# Patient Record
Sex: Female | Born: 1950 | Race: White | Hispanic: No | Marital: Married | Smoking: Former smoker
Health system: Southern US, Community
[De-identification: ages and names within clinical notes are randomized; demographics above are authoritative.]

## PROBLEM LIST (undated history)

## (undated) DIAGNOSIS — I480 Paroxysmal atrial fibrillation: Secondary | ICD-10-CM

## (undated) DIAGNOSIS — I483 Typical atrial flutter: Secondary | ICD-10-CM

## (undated) DIAGNOSIS — I455 Other specified heart block: Secondary | ICD-10-CM

## (undated) DIAGNOSIS — R000 Tachycardia, unspecified: Secondary | ICD-10-CM

## (undated) HISTORY — DX: Other specified heart block: I45.5

## (undated) HISTORY — DX: Typical atrial flutter: I48.3

## (undated) HISTORY — DX: Tachycardia, unspecified: R00.0

## (undated) HISTORY — PX: TONSILLECTOMY: SUR1361

## (undated) HISTORY — DX: Paroxysmal atrial fibrillation: I48.0

---

## 2005-10-05 ENCOUNTER — Other Ambulatory Visit: Admission: RE | Admit: 2005-10-05 | Discharge: 2005-10-05 | Payer: Self-pay | Admitting: Obstetrics and Gynecology

## 2005-11-16 ENCOUNTER — Encounter: Admission: RE | Admit: 2005-11-16 | Discharge: 2005-11-16 | Payer: Self-pay | Admitting: Obstetrics and Gynecology

## 2007-01-10 ENCOUNTER — Encounter: Admission: RE | Admit: 2007-01-10 | Discharge: 2007-01-10 | Payer: Self-pay | Admitting: Obstetrics and Gynecology

## 2008-01-16 ENCOUNTER — Encounter: Admission: RE | Admit: 2008-01-16 | Discharge: 2008-01-16 | Payer: Self-pay | Admitting: Obstetrics and Gynecology

## 2009-03-16 ENCOUNTER — Encounter: Admission: RE | Admit: 2009-03-16 | Discharge: 2009-03-16 | Payer: Self-pay | Admitting: Obstetrics and Gynecology

## 2010-03-17 ENCOUNTER — Encounter: Admission: RE | Admit: 2010-03-17 | Discharge: 2010-03-17 | Payer: Self-pay | Admitting: Obstetrics and Gynecology

## 2011-03-31 ENCOUNTER — Other Ambulatory Visit: Payer: Self-pay | Admitting: Obstetrics and Gynecology

## 2011-03-31 DIAGNOSIS — Z1231 Encounter for screening mammogram for malignant neoplasm of breast: Secondary | ICD-10-CM

## 2011-04-21 ENCOUNTER — Ambulatory Visit
Admission: RE | Admit: 2011-04-21 | Discharge: 2011-04-21 | Disposition: A | Discharge status: Home / Self Care | Payer: BC Managed Care – PPO | Source: Ambulatory Visit | Attending: Obstetrics and Gynecology | Admitting: Obstetrics and Gynecology

## 2011-04-21 DIAGNOSIS — Z1231 Encounter for screening mammogram for malignant neoplasm of breast: Secondary | ICD-10-CM

## 2015-01-07 ENCOUNTER — Ambulatory Visit (INDEPENDENT_AMBULATORY_CARE_PROVIDER_SITE_OTHER): Payer: BLUE CROSS/BLUE SHIELD | Admitting: Podiatry

## 2015-01-07 ENCOUNTER — Encounter: Payer: Self-pay | Admitting: Podiatry

## 2015-01-07 ENCOUNTER — Ambulatory Visit (INDEPENDENT_AMBULATORY_CARE_PROVIDER_SITE_OTHER): Payer: BLUE CROSS/BLUE SHIELD

## 2015-01-07 VITALS — BP 112/68 | HR 66 | Resp 10 | Ht 69.0 in | Wt 150.0 lb

## 2015-01-07 DIAGNOSIS — M722 Plantar fascial fibromatosis: Secondary | ICD-10-CM

## 2015-01-07 DIAGNOSIS — M79671 Pain in right foot: Secondary | ICD-10-CM

## 2015-01-07 MED ORDER — MELOXICAM 7.5 MG PO TABS: 7.5000 mg | ORAL_TABLET | Freq: Every day | ORAL | Status: DC

## 2015-01-07 MED ORDER — TRIAMCINOLONE ACETONIDE 10 MG/ML IJ SUSP
10.0000 mg | Freq: Once | INTRAMUSCULAR | Status: AC
  Administered 2015-01-07: 10 mg

## 2015-01-07 NOTE — Progress Notes (Signed)
   Subjective:    Patient ID: Annette CablesCynthia Coombs, female    DOB: 12/27/1950, 64 y.o.   MRN: [id number]  HPI 64 year old female presents the office today with complaints of right heel pain which has been ongoing for approximately 3-4 months. She states that she has pain in her heel. Her pain is particularly after rest and is relieved somewhat with ambulation. More recently the pain has become more consistent as a dull ache to the bottom of her heel and denies any sharp pains. The pain does not wake her up at night. She denies any recent injury or trauma to the area. She denies any change in activity at the onset of symptoms. Denies any swelling or redness over the area. She has been stretching and taking over-the-counter anti-inflammatories without much relief in symptoms. No other complaints at this time.   Review of Systems  All other systems reviewed and are negative.      Objective:   Physical Exam AAO x3, NAD DP/PT pulses palpable bilaterally, CRT less than 3 seconds Protective sensation intact with Simms Weinstein monofilament, vibratory sensation intact, Achilles tendon reflex intact Tenderness to palpation overlying the plantar medial tubercle of the calcaneus to right heel at the insertion of the plantar fascia. There is no pain along the course of plantar fascial within the arch of the foot. There is no pain with lateral compression of the calcaneus or pain the vibratory sensation. No pain on the posterior aspect of the calcaneus or along the course/insertion of the Achilles tendon. There is no overlying edema, erythema, increase in warmth. No other areas of tenderness palpation or pain with vibratory sensation to the foot/ankle to bilateral lower extremities. Decrease in first MTPJ range of motion bilaterally. MMT 5/5, ROM WNL except for otherwise mentioned. No open lesions or pre-ulcerative lesions are identified. No pain with calf compression, swelling, warmth, erythema.       Assessment & Plan:  64 year old female with right heel pain, likely plantar fasciitis. -X-rays were obtained and reviewed with the patient. -Treatment options were discussed the patient including alternatives, risks, complications. -Patient elects to proceed with steroid injection into the right heel. Under sterile skin preparation, a total of 2.5cc of kenalog 10, 0.5% Marcaine plain, and 2% lidocaine plain were infiltrated into the symptomatic area without complication. A band-aid was applied. Patient tolerated the injection well without complication. Post-injection care with discussed with the patient. Discussed with the patient to ice the area over the next couple of days to help prevent a steroid flare.  -Plantar fascial taping was applied. Once the tape is removed dispensed a plantar fascial brace to wear. -Prescribed meloxicam. Discussed side effects the medication directed to stop if any are to occur call the office. -Discussed stretching exercises. -Ice to the area. -Continue with supportive shoes at all times. She has recently purchased new, more supportive shoes. -Follow-up in 3 weeks or sooner if any problems are to arise. In the meantime, encouraged to call the office with any questions, concerns, change in symptoms.

## 2015-01-07 NOTE — Patient Instructions (Signed)
Plantar Fasciitis (Heel Spur Syndrome) with Rehab The plantar fascia is a fibrous, ligament-like, soft-tissue structure that spans the bottom of the foot. Plantar fasciitis is a condition that causes pain in the foot due to inflammation of the tissue. SYMPTOMS   Pain and tenderness on the underneath side of the foot.  Pain that worsens with standing or walking. CAUSES  Plantar fasciitis is caused by irritation and injury to the plantar fascia on the underneath side of the foot. Common mechanisms of injury include:  Direct trauma to bottom of the foot.  Damage to a small nerve that runs under the foot where the main fascia attaches to the heel bone.  Stress placed on the plantar fascia due to bone spurs. RISK INCREASES WITH:   Activities that place stress on the plantar fascia (running, jumping, pivoting, or cutting).  Poor strength and flexibility.  Improperly fitted shoes.  Tight calf muscles.  Flat feet.  Failure to warm-up properly before activity.  Obesity. PREVENTION  Warm up and stretch properly before activity.  Allow for adequate recovery between workouts.  Maintain physical fitness:  Strength, flexibility, and endurance.  Cardiovascular fitness.  Maintain a health body weight.  Avoid stress on the plantar fascia.  Wear properly fitted shoes, including arch supports for individuals who have flat feet. PROGNOSIS  If treated properly, then the symptoms of plantar fasciitis usually resolve without surgery. However, occasionally surgery is necessary. RELATED COMPLICATIONS   Recurrent symptoms that may result in a chronic condition.  Problems of the lower back that are caused by compensating for the injury, such as limping.  Pain or weakness of the foot during push-off following surgery.  Chronic inflammation, scarring, and partial or complete fascia tear, occurring more often from repeated injections. TREATMENT  Treatment initially involves the use of  ice and medication to help reduce pain and inflammation. The use of strengthening and stretching exercises may help reduce pain with activity, especially stretches of the Achilles tendon. These exercises may be performed at home or with a therapist. Your caregiver may recommend that you use heel cups of arch supports to help reduce stress on the plantar fascia. Occasionally, corticosteroid injections are given to reduce inflammation. If symptoms persist for greater than 6 months despite non-surgical (conservative), then surgery may be recommended.  MEDICATION   If pain medication is necessary, then nonsteroidal anti-inflammatory medications, such as aspirin and ibuprofen, or other minor pain relievers, such as acetaminophen, are often recommended.  Do not take pain medication within 7 days before surgery.  Prescription pain relievers may be given if deemed necessary by your caregiver. Use only as directed and only as much as you need.  Corticosteroid injections may be given by your caregiver. These injections should be reserved for the most serious cases, because they may only be given a certain number of times. HEAT AND COLD  Cold treatment (icing) relieves pain and reduces inflammation. Cold treatment should be applied for 10 to 15 minutes every 2 to 3 hours for inflammation and pain and immediately after any activity that aggravates your symptoms. Use ice packs or massage the area with a piece of ice (ice massage).  Heat treatment may be used prior to performing the stretching and strengthening activities prescribed by your caregiver, physical therapist, or athletic trainer. Use a heat pack or soak the injury in warm water. SEEK IMMEDIATE MEDICAL CARE IF:  Treatment seems to offer no benefit, or the condition worsens.  Any medications produce adverse side effects. EXERCISES RANGE   OF MOTION (ROM) AND STRETCHING EXERCISES - Plantar Fasciitis (Heel Spur Syndrome) These exercises may help you  when beginning to rehabilitate your injury. Your symptoms may resolve with or without further involvement from your physician, physical therapist or athletic trainer. While completing these exercises, remember:   Restoring tissue flexibility helps normal motion to return to the joints. This allows healthier, less painful movement and activity.  An effective stretch should be held for at least 30 seconds.  A stretch should never be painful. You should only feel a gentle lengthening or release in the stretched tissue. RANGE OF MOTION - Toe Extension, Flexion  Sit with your right / left leg crossed over your opposite knee.  Grasp your toes and gently pull them back toward the top of your foot. You should feel a stretch on the bottom of your toes and/or foot.  Hold this stretch for __________ seconds.  Now, gently pull your toes toward the bottom of your foot. You should feel a stretch on the top of your toes and or foot.  Hold this stretch for __________ seconds. Repeat __________ times. Complete this stretch __________ times per day.  RANGE OF MOTION - Ankle Dorsiflexion, Active Assisted  Remove shoes and sit on a chair that is preferably not on a carpeted surface.  Place right / left foot under knee. Extend your opposite leg for support.  Keeping your heel down, slide your right / left foot back toward the chair until you feel a stretch at your ankle or calf. If you do not feel a stretch, slide your bottom forward to the edge of the chair, while still keeping your heel down.  Hold this stretch for __________ seconds. Repeat __________ times. Complete this stretch __________ times per day.  STRETCH - Gastroc, Standing  Place hands on wall.  Extend right / left leg, keeping the front knee somewhat bent.  Slightly point your toes inward on your back foot.  Keeping your right / left heel on the floor and your knee straight, shift your weight toward the wall, not allowing your back to  arch.  You should feel a gentle stretch in the right / left calf. Hold this position for __________ seconds. Repeat __________ times. Complete this stretch __________ times per day. STRETCH - Soleus, Standing  Place hands on wall.  Extend right / left leg, keeping the other knee somewhat bent.  Slightly point your toes inward on your back foot.  Keep your right / left heel on the floor, bend your back knee, and slightly shift your weight over the back leg so that you feel a gentle stretch deep in your back calf.  Hold this position for __________ seconds. Repeat __________ times. Complete this stretch __________ times per day. STRETCH - Gastrocsoleus, Standing  Note: This exercise can place a lot of stress on your foot and ankle. Please complete this exercise only if specifically instructed by your caregiver.   Place the ball of your right / left foot on a step, keeping your other foot firmly on the same step.  Hold on to the wall or a rail for balance.  Slowly lift your other foot, allowing your body weight to press your heel down over the edge of the step.  You should feel a stretch in your right / left calf.  Hold this position for __________ seconds.  Repeat this exercise with a slight bend in your right / left knee. Repeat __________ times. Complete this stretch __________ times per day.    STRENGTHENING EXERCISES - Plantar Fasciitis (Heel Spur Syndrome)  These exercises may help you when beginning to rehabilitate your injury. They may resolve your symptoms with or without further involvement from your physician, physical therapist or athletic trainer. While completing these exercises, remember:   Muscles can gain both the endurance and the strength needed for everyday activities through controlled exercises.  Complete these exercises as instructed by your physician, physical therapist or athletic trainer. Progress the resistance and repetitions only as guided. STRENGTH -  Towel Curls  Sit in a chair positioned on a non-carpeted surface.  Place your foot on a towel, keeping your heel on the floor.  Pull the towel toward your heel by only curling your toes. Keep your heel on the floor.  If instructed by your physician, physical therapist or athletic trainer, add ____________________ at the end of the towel. Repeat __________ times. Complete this exercise __________ times per day. STRENGTH - Ankle Inversion  Secure one end of a rubber exercise band/tubing to a fixed object (table, pole). Loop the other end around your foot just before your toes.  Place your fists between your knees. This will focus your strengthening at your ankle.  Slowly, pull your big toe up and in, making sure the band/tubing is positioned to resist the entire motion.  Hold this position for __________ seconds.  Have your muscles resist the band/tubing as it slowly pulls your foot back to the starting position. Repeat __________ times. Complete this exercises __________ times per day.  Document Released: 11/21/2005 Document Revised: 02/13/2012 Document Reviewed: 03/05/2009 [employer] Patient Information 2015 [employer]. This information is not intended to replace advice given to you by your health care provider. Make sure you discuss any questions you have with your health care provider.  

## 2015-01-28 ENCOUNTER — Ambulatory Visit (INDEPENDENT_AMBULATORY_CARE_PROVIDER_SITE_OTHER): Payer: BLUE CROSS/BLUE SHIELD | Admitting: Podiatry

## 2015-01-28 ENCOUNTER — Encounter: Payer: Self-pay | Admitting: Podiatry

## 2015-01-28 VITALS — BP 99/68 | HR 62 | Resp 18

## 2015-01-28 DIAGNOSIS — M722 Plantar fascial fibromatosis: Secondary | ICD-10-CM

## 2015-01-28 NOTE — Patient Instructions (Signed)
Plantar Fasciitis (Heel Spur Syndrome) with Rehab The plantar fascia is a fibrous, ligament-like, soft-tissue structure that spans the bottom of the foot. Plantar fasciitis is a condition that causes pain in the foot due to inflammation of the tissue. SYMPTOMS   Pain and tenderness on the underneath side of the foot.  Pain that worsens with standing or walking. CAUSES  Plantar fasciitis is caused by irritation and injury to the plantar fascia on the underneath side of the foot. Common mechanisms of injury include:  Direct trauma to bottom of the foot.  Damage to a small nerve that runs under the foot where the main fascia attaches to the heel bone.  Stress placed on the plantar fascia due to bone spurs. RISK INCREASES WITH:   Activities that place stress on the plantar fascia (running, jumping, pivoting, or cutting).  Poor strength and flexibility.  Improperly fitted shoes.  Tight calf muscles.  Flat feet.  Failure to warm-up properly before activity.  Obesity. PREVENTION  Warm up and stretch properly before activity.  Allow for adequate recovery between workouts.  Maintain physical fitness:  Strength, flexibility, and endurance.  Cardiovascular fitness.  Maintain a health body weight.  Avoid stress on the plantar fascia.  Wear properly fitted shoes, including arch supports for individuals who have flat feet. PROGNOSIS  If treated properly, then the symptoms of plantar fasciitis usually resolve without surgery. However, occasionally surgery is necessary. RELATED COMPLICATIONS   Recurrent symptoms that may result in a chronic condition.  Problems of the lower back that are caused by compensating for the injury, such as limping.  Pain or weakness of the foot during push-off following surgery.  Chronic inflammation, scarring, and partial or complete fascia tear, occurring more often from repeated injections. TREATMENT  Treatment initially involves the use of  ice and medication to help reduce pain and inflammation. The use of strengthening and stretching exercises may help reduce pain with activity, especially stretches of the Achilles tendon. These exercises may be performed at home or with a therapist. Your caregiver may recommend that you use heel cups of arch supports to help reduce stress on the plantar fascia. Occasionally, corticosteroid injections are given to reduce inflammation. If symptoms persist for greater than 6 months despite non-surgical (conservative), then surgery may be recommended.  MEDICATION   If pain medication is necessary, then nonsteroidal anti-inflammatory medications, such as aspirin and ibuprofen, or other minor pain relievers, such as acetaminophen, are often recommended.  Do not take pain medication within 7 days before surgery.  Prescription pain relievers may be given if deemed necessary by your caregiver. Use only as directed and only as much as you need.  Corticosteroid injections may be given by your caregiver. These injections should be reserved for the most serious cases, because they may only be given a certain number of times. HEAT AND COLD  Cold treatment (icing) relieves pain and reduces inflammation. Cold treatment should be applied for 10 to 15 minutes every 2 to 3 hours for inflammation and pain and immediately after any activity that aggravates your symptoms. Use ice packs or massage the area with a piece of ice (ice massage).  Heat treatment may be used prior to performing the stretching and strengthening activities prescribed by your caregiver, physical therapist, or athletic trainer. Use a heat pack or soak the injury in warm water. SEEK IMMEDIATE MEDICAL CARE IF:  Treatment seems to offer no benefit, or the condition worsens.  Any medications produce adverse side effects. EXERCISES RANGE   OF MOTION (ROM) AND STRETCHING EXERCISES - Plantar Fasciitis (Heel Spur Syndrome) These exercises may help you  when beginning to rehabilitate your injury. Your symptoms may resolve with or without further involvement from your physician, physical therapist or athletic trainer. While completing these exercises, remember:   Restoring tissue flexibility helps normal motion to return to the joints. This allows healthier, less painful movement and activity.  An effective stretch should be held for at least 30 seconds.  A stretch should never be painful. You should only feel a gentle lengthening or release in the stretched tissue. RANGE OF MOTION - Toe Extension, Flexion  Sit with your right / left leg crossed over your opposite knee.  Grasp your toes and gently pull them back toward the top of your foot. You should feel a stretch on the bottom of your toes and/or foot.  Hold this stretch for __________ seconds.  Now, gently pull your toes toward the bottom of your foot. You should feel a stretch on the top of your toes and or foot.  Hold this stretch for __________ seconds. Repeat __________ times. Complete this stretch __________ times per day.  RANGE OF MOTION - Ankle Dorsiflexion, Active Assisted  Remove shoes and sit on a chair that is preferably not on a carpeted surface.  Place right / left foot under knee. Extend your opposite leg for support.  Keeping your heel down, slide your right / left foot back toward the chair until you feel a stretch at your ankle or calf. If you do not feel a stretch, slide your bottom forward to the edge of the chair, while still keeping your heel down.  Hold this stretch for __________ seconds. Repeat __________ times. Complete this stretch __________ times per day.  STRETCH - Gastroc, Standing  Place hands on wall.  Extend right / left leg, keeping the front knee somewhat bent.  Slightly point your toes inward on your back foot.  Keeping your right / left heel on the floor and your knee straight, shift your weight toward the wall, not allowing your back to  arch.  You should feel a gentle stretch in the right / left calf. Hold this position for __________ seconds. Repeat __________ times. Complete this stretch __________ times per day. STRETCH - Soleus, Standing  Place hands on wall.  Extend right / left leg, keeping the other knee somewhat bent.  Slightly point your toes inward on your back foot.  Keep your right / left heel on the floor, bend your back knee, and slightly shift your weight over the back leg so that you feel a gentle stretch deep in your back calf.  Hold this position for __________ seconds. Repeat __________ times. Complete this stretch __________ times per day. STRETCH - Gastrocsoleus, Standing  Note: This exercise can place a lot of stress on your foot and ankle. Please complete this exercise only if specifically instructed by your caregiver.   Place the ball of your right / left foot on a step, keeping your other foot firmly on the same step.  Hold on to the wall or a rail for balance.  Slowly lift your other foot, allowing your body weight to press your heel down over the edge of the step.  You should feel a stretch in your right / left calf.  Hold this position for __________ seconds.  Repeat this exercise with a slight bend in your right / left knee. Repeat __________ times. Complete this stretch __________ times per day.    STRENGTHENING EXERCISES - Plantar Fasciitis (Heel Spur Syndrome)  These exercises may help you when beginning to rehabilitate your injury. They may resolve your symptoms with or without further involvement from your physician, physical therapist or athletic trainer. While completing these exercises, remember:   Muscles can gain both the endurance and the strength needed for everyday activities through controlled exercises.  Complete these exercises as instructed by your physician, physical therapist or athletic trainer. Progress the resistance and repetitions only as guided. STRENGTH -  Towel Curls  Sit in a chair positioned on a non-carpeted surface.  Place your foot on a towel, keeping your heel on the floor.  Pull the towel toward your heel by only curling your toes. Keep your heel on the floor.  If instructed by your physician, physical therapist or athletic trainer, add ____________________ at the end of the towel. Repeat __________ times. Complete this exercise __________ times per day. STRENGTH - Ankle Inversion  Secure one end of a rubber exercise band/tubing to a fixed object (table, pole). Loop the other end around your foot just before your toes.  Place your fists between your knees. This will focus your strengthening at your ankle.  Slowly, pull your big toe up and in, making sure the band/tubing is positioned to resist the entire motion.  Hold this position for __________ seconds.  Have your muscles resist the band/tubing as it slowly pulls your foot back to the starting position. Repeat __________ times. Complete this exercises __________ times per day.  Document Released: 11/21/2005 Document Revised: 02/13/2012 Document Reviewed: 03/05/2009 [employer] Patient Information 2015 [employer]. This information is not intended to replace advice given to you by your health care provider. Make sure you discuss any questions you have with your health care provider.  

## 2015-02-01 NOTE — Progress Notes (Signed)
Patient ID: Annette CablesCynthia Hampton, female   DOB: 12/05/1951, 64 y.o.   MRN: [id number]  Subjective: 64 year old female presents the office they for follow-up evaluation of right heel pain, plantar fasciitis. She states that since last appointment she is significantly improved although she does continue to have some mild intermittent discomfort. She's been continuing the stretching and icing exercises daily. She is also committed taking the anti-inflammatory as needed. She is requesting another steroid injection at this time to help resolve any lingering pain. She denies any acute changes since last appointment and no other complaints at this time. Denies any systemic complaints as fevers, chills, nausea, vomiting.  Objective: AAO x3, NAD DP/PT pulses palpable bilaterally, CRT less than 3 seconds Protective sensation intact with Simms Weinstein monofilament, vibratory sensation intact, Achilles tendon reflex intact There is mild tenderness to palpation overlying the plantar medial tubercle of the calcaneus to right heel at the insertion of the plantar fascia, although it does appear to be decreased compared to last appointment. There is no pain along the course of plantar fascial within the arch of the foot and the plantar fascia appears intact. There is no pain with lateral compression of the calcaneus or pain the vibratory sensation. No pain on the posterior aspect of the calcaneus or along the course/insertion of the Achilles tendon. There is no overlying edema, erythema, increase in warmth. No other areas of tenderness palpation or pain with vibratory sensation to the foot/ankle bilaterally. MMT 5/5, ROM WNL No open lesions or pre-ulcerative lesions are identified. No pain with calf compression, swelling, warmth, erythema.  Assessment: 64 year old female with resolving right heel pain, plantar fasciitis.  Plan: -Treatment options were discussed the patient including alternatives, risks,  complications. -Patient elects to proceed with steroid injection into the right heel. Under sterile skin preparation, a total of 2.5cc of kenalog 10, 0.5% Marcaine plain, and 2% lidocaine plain were infiltrated into the symptomatic area without complication. A band-aid was applied. Patient tolerated the injection well without complication. Post-injection care with discussed with the patient. Discussed with the patient to ice the area over the next couple of days to help prevent a steroid flare.  -Plantar fascial taping was applied. Once the tape is removed she can continue the plantar fascial brace. -Continue with ice and stretching activities. -Continue meloxicam as needed. Discussed side effects and directed to stop immediately if any are to occur call the office. -Discussed shoe modifications and possible orthotics.

## 2015-02-25 ENCOUNTER — Ambulatory Visit: Payer: BLUE CROSS/BLUE SHIELD | Admitting: Podiatry

## 2016-08-23 ENCOUNTER — Other Ambulatory Visit: Payer: Self-pay | Admitting: Obstetrics and Gynecology

## 2016-08-23 DIAGNOSIS — M810 Age-related osteoporosis without current pathological fracture: Secondary | ICD-10-CM

## 2016-08-29 ENCOUNTER — Other Ambulatory Visit: Payer: Self-pay | Admitting: Obstetrics and Gynecology

## 2016-08-29 DIAGNOSIS — E2839 Other primary ovarian failure: Secondary | ICD-10-CM

## 2016-09-27 ENCOUNTER — Other Ambulatory Visit: Payer: Self-pay

## 2016-10-04 ENCOUNTER — Ambulatory Visit
Admission: RE | Admit: 2016-10-04 | Discharge: 2016-10-04 | Disposition: A | Discharge status: Home / Self Care | Payer: Medicare Other | Source: Ambulatory Visit | Attending: Obstetrics and Gynecology | Admitting: Obstetrics and Gynecology

## 2016-10-04 DIAGNOSIS — E2839 Other primary ovarian failure: Secondary | ICD-10-CM

## 2019-07-02 ENCOUNTER — Other Ambulatory Visit: Payer: Self-pay | Admitting: Internal Medicine

## 2019-07-02 DIAGNOSIS — M810 Age-related osteoporosis without current pathological fracture: Secondary | ICD-10-CM

## 2019-09-12 ENCOUNTER — Other Ambulatory Visit: Payer: Self-pay

## 2019-09-12 ENCOUNTER — Ambulatory Visit
Admission: RE | Admit: 2019-09-12 | Discharge: 2019-09-12 | Disposition: A | Discharge status: Home / Self Care | Payer: Medicare Other | Source: Ambulatory Visit | Attending: Internal Medicine | Admitting: Internal Medicine

## 2019-09-12 DIAGNOSIS — M810 Age-related osteoporosis without current pathological fracture: Secondary | ICD-10-CM

## 2019-12-24 ENCOUNTER — Ambulatory Visit: Payer: Medicare Other | Attending: Nurse Practitioner

## 2019-12-24 DIAGNOSIS — Z23 Encounter for immunization: Secondary | ICD-10-CM | POA: Insufficient documentation

## 2019-12-24 NOTE — Progress Notes (Signed)
   Covid-19 Vaccination Clinic  Name:  Annette Hampton    MRN: [id number] DOB: 1951/01/17  12/24/2019  Annette Hampton was observed post Covid-19 immunization for 15 minutes without incidence. She was provided with Vaccine Information Sheet and instruction to access the V-Safe system.   Annette Hampton was instructed to call 911 with any severe reactions post vaccine: Marland Kitchen Difficulty breathing  . Swelling of your face and throat  . A fast heartbeat  . A bad rash all over your body  . Dizziness and weakness    Immunizations Administered    Name Date Dose VIS Date Route   Pfizer COVID-19 Vaccine 12/24/2019  6:02 PM 0.3 mL 11/15/2019 Intramuscular   Manufacturer: [employer], [employer]   Lot: [id number]   NDC: [address]-9

## 2020-01-12 ENCOUNTER — Ambulatory Visit: Payer: Medicare Other | Attending: Internal Medicine

## 2020-01-12 DIAGNOSIS — Z23 Encounter for immunization: Secondary | ICD-10-CM | POA: Insufficient documentation

## 2020-01-12 NOTE — Progress Notes (Signed)
   Covid-19 Vaccination Clinic  Name:  Annette Hampton    MRN: [id number] DOB: Jan 05, 1951  01/12/2020  [name] was observed post Covid-19 immunization for 15 minutes without incidence. She was provided with Vaccine Information Sheet and instruction to access the V-Safe system.   [name] was instructed to call 911 with any severe reactions post vaccine: Marland Kitchen Difficulty breathing  . Swelling of your face and throat  . A fast heartbeat  . A bad rash all over your body  . Dizziness and weakness    Immunizations Administered    Name Date Dose VIS Date Route   Pfizer COVID-19 Vaccine 01/12/2020 11:21 AM 0.3 mL 11/15/2019 Intramuscular   Manufacturer: [employer], [employer]   Lot: [id number]   NDC: [id number]

## 2020-07-16 ENCOUNTER — Other Ambulatory Visit (HOSPITAL_COMMUNITY)
Admission: RE | Admit: 2020-07-16 | Discharge: 2020-07-16 | Disposition: A | Discharge status: Home / Self Care | Payer: Medicare Other | Source: Ambulatory Visit | Attending: Cardiology | Admitting: Cardiology

## 2020-07-16 ENCOUNTER — Ambulatory Visit: Payer: Medicare Other | Admitting: Cardiology

## 2020-07-16 ENCOUNTER — Encounter: Payer: Self-pay | Admitting: Cardiology

## 2020-07-16 ENCOUNTER — Other Ambulatory Visit: Payer: Self-pay

## 2020-07-16 VITALS — BP 119/85 | HR 143 | Resp 17 | Ht 69.0 in | Wt 160.0 lb

## 2020-07-16 DIAGNOSIS — I483 Typical atrial flutter: Secondary | ICD-10-CM

## 2020-07-16 DIAGNOSIS — Z01812 Encounter for preprocedural laboratory examination: Secondary | ICD-10-CM | POA: Diagnosis present

## 2020-07-16 DIAGNOSIS — Z20822 Contact with and (suspected) exposure to covid-19: Secondary | ICD-10-CM | POA: Insufficient documentation

## 2020-07-16 DIAGNOSIS — L603 Nail dystrophy: Secondary | ICD-10-CM | POA: Insufficient documentation

## 2020-07-16 LAB — SARS CORONAVIRUS 2 (TAT 6-24 HRS): SARS Coronavirus 2: NEGATIVE

## 2020-07-16 MED ORDER — APIXABAN 5 MG PO TABS: 5.0000 mg | ORAL_TABLET | Freq: Two times a day (BID) | ORAL | 2 refills | Status: DC

## 2020-07-16 NOTE — Progress Notes (Signed)
Patient referred by Wenda Low, MD for tachycardia  Subjective:   Annette Hampton, female    DOB: 04-12-1951, 69 y.o.   MRN: [id number]   Chief Complaint  Patient presents with  . Tachycardia  . New Patient (Initial Visit)     HPI  69 y.o. Caucasian female with tachycardia.  Patient has no baseline medical problems, other than osteoporosis.  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 She denies any palpitations symptoms. Also denies orthopnea, PND, leg edema.  Patient was at her routine PCP visit on 07/13/2020. EKG then and now showed tachycardia >140 bpm, details below.   She occasionally drinks a beer, drinks 2 cups of coffee everyday. She has not performed any strenuous exercise since her visit with her PCP. She was started on metoprolol xL 25 mg daily by PCP Dr. Denton Ar.     Past Medical History:  Diagnosis Date  . Tachycardia      Past Surgical History:  Procedure Laterality Date  . TONSILLECTOMY       Social History   Tobacco Use  Smoking Status Former Smoker  . Packs/day: 0.25  . Years: 3.00  . Pack years: 0.75  . Types: Cigarettes  . Quit date: 73  . Years since quitting: 23.6  Smokeless Tobacco Former Systems developer    Social History   Substance and Sexual Activity  Alcohol Use Yes   Comment: occasional beer     Family History  Problem Relation Age of Onset  . Parkinson's disease Father      Current Outpatient Medications on File Prior to Visit  Medication Sig Dispense Refill  . CALCIUM CITRATE PO Take by mouth.    . Multiple Vitamin (MULTI-VITAMIN DAILY PO) Take by mouth.    Marland Kitchen alendronate (FOSAMAX) 70 MG tablet Take 70 mg by mouth once a week.    . metoprolol succinate (TOPROL-XL) 25 MG 24 hr tablet Take 25 mg by mouth daily.     No current facility-administered medications on file prior to visit.    Cardiovascular and other  pertinent studies:  EKG 07/16/2020: Typical atrial flutter 2: 1 conduction RVR 145 bpm Nonspecific ST-T changes   EKG 07/13/2020: Typical atrial flutter 144 bpm  2: 1 AV conduction    Recent labs: 07/13/2020: Glucose 89, BUN/Cr 17/1.13. EGFR 48/58. Na/K 141/4.4. T.bili 1.5. Rest of the CMP normal H/H 16/46. MCV 101. Platelets 194 Chol 191, TG 73, HDL 78, LDL 100 TSH 2.8 normal    Review of Systems  Cardiovascular: Negative for chest pain, dyspnea on exertion, leg swelling, palpitations and syncope.         Vitals:   07/16/20 1415  BP: 119/85  Pulse: (!) 143  Resp: 17  SpO2: 97%     Body mass index is 23.63 kg/m. Filed Weights   07/16/20 1415  Weight: 160 lb (72.6 kg)     Objective:   Physical Exam Vitals and nursing note reviewed.  Constitutional:      General: She is not in acute distress. Neck:     Vascular: No JVD.  Cardiovascular:     Rate and Rhythm: Regular rhythm. Tachycardia present.     Pulses: Normal pulses.     Heart sounds: Normal heart sounds. No murmur heard.   Pulmonary:     Effort: Pulmonary effort is normal.     Breath sounds: Normal breath sounds.  No wheezing or rales.          Assessment & Recommendations:    69 y/o Caucasian female with osteoporosis, now with new onset atrial flutter with RVR   Atrial flutter: Typical atrial flutter, present acute since 07/13/2020.  Patient does not have any specific symptoms related to it, thus making estimation of acute onset difficult. RVR in 140s while on metoprolol succinate 25 mg daily. Recommend increasing to 50 mg daily. CHA2DS2-VASc score 2, annual stroke risk 2%. Recommend eliquis 5 mg bid for now But she does not have any symptoms, RVR in 140s is not sustainable. Recommend TEE prior to cardioversion and referral to EP for consideration for ablation. After cardioversion, will consider placing on event monitor to look for recurrence of atrial flutter.  Recent labs with PCP,  detailed above.  Rapid Covid testing   Thank you for referring the patient to [place]. Please feel free to contact with any questions.  Nigel Mormon, MD [facility] Pager: [phone] Office: [phone]

## 2020-07-17 ENCOUNTER — Ambulatory Visit (HOSPITAL_COMMUNITY)
Admission: RE | Admit: 2020-07-17 | Discharge: 2020-07-17 | Disposition: A | Discharge status: Home / Self Care | Payer: Medicare Other | Source: Home / Self Care | Attending: Cardiology | Admitting: Cardiology

## 2020-07-17 ENCOUNTER — Ambulatory Visit (HOSPITAL_COMMUNITY)
Admission: RE | Admit: 2020-07-17 | Discharge: 2020-07-17 | Disposition: A | Discharge status: Home / Self Care | Payer: Medicare Other | Attending: Cardiology | Admitting: Cardiology

## 2020-07-17 ENCOUNTER — Ambulatory Visit (HOSPITAL_COMMUNITY): Payer: Medicare Other | Admitting: Certified Registered"

## 2020-07-17 ENCOUNTER — Encounter (HOSPITAL_COMMUNITY): Payer: Self-pay | Admitting: Cardiology

## 2020-07-17 ENCOUNTER — Other Ambulatory Visit: Payer: Self-pay | Admitting: Cardiology

## 2020-07-17 ENCOUNTER — Encounter (HOSPITAL_COMMUNITY)
Admission: RE | Disposition: A | Discharge status: Home / Self Care | Payer: Self-pay | Source: Home / Self Care | Attending: Cardiology

## 2020-07-17 DIAGNOSIS — I4892 Unspecified atrial flutter: Secondary | ICD-10-CM | POA: Diagnosis present

## 2020-07-17 DIAGNOSIS — Z7901 Long term (current) use of anticoagulants: Secondary | ICD-10-CM | POA: Insufficient documentation

## 2020-07-17 DIAGNOSIS — Z79899 Other long term (current) drug therapy: Secondary | ICD-10-CM | POA: Insufficient documentation

## 2020-07-17 DIAGNOSIS — M810 Age-related osteoporosis without current pathological fracture: Secondary | ICD-10-CM | POA: Diagnosis not present

## 2020-07-17 DIAGNOSIS — R000 Tachycardia, unspecified: Secondary | ICD-10-CM | POA: Diagnosis not present

## 2020-07-17 DIAGNOSIS — I483 Typical atrial flutter: Secondary | ICD-10-CM

## 2020-07-17 DIAGNOSIS — Z87891 Personal history of nicotine dependence: Secondary | ICD-10-CM | POA: Insufficient documentation

## 2020-07-17 HISTORY — PX: TEE WITHOUT CARDIOVERSION: SHX5443

## 2020-07-17 HISTORY — PX: CARDIOVERSION: SHX1299

## 2020-07-17 SURGERY — ECHOCARDIOGRAM, TRANSESOPHAGEAL
Anesthesia: Monitor Anesthesia Care

## 2020-07-17 MED ORDER — METOPROLOL SUCCINATE ER 25 MG PO TB24: 25.0000 mg | ORAL_TABLET | Freq: Every day | ORAL | 0 refills | Status: DC

## 2020-07-17 MED ORDER — PROPOFOL 500 MG/50ML IV EMUL
INTRAVENOUS | Status: DC | PRN
  Administered 2020-07-17: 100 ug/kg/min via INTRAVENOUS

## 2020-07-17 MED ORDER — PHENYLEPHRINE 40 MCG/ML (10ML) SYRINGE FOR IV PUSH (FOR BLOOD PRESSURE SUPPORT)
PREFILLED_SYRINGE | INTRAVENOUS | Status: DC | PRN
  Administered 2020-07-17: 80 ug via INTRAVENOUS

## 2020-07-17 MED ORDER — PROPOFOL 10 MG/ML IV BOLUS
INTRAVENOUS | Status: DC | PRN
  Administered 2020-07-17: 20 mg via INTRAVENOUS
  Administered 2020-07-17: 30 mg via INTRAVENOUS

## 2020-07-17 MED ORDER — BUTAMBEN-TETRACAINE-BENZOCAINE 2-2-14 % EX AERO
INHALATION_SPRAY | CUTANEOUS | Status: DC | PRN
  Administered 2020-07-17: 2 via TOPICAL

## 2020-07-17 MED ORDER — SODIUM CHLORIDE 0.9 % IV SOLN: INTRAVENOUS | Status: DC

## 2020-07-17 NOTE — Anesthesia Postprocedure Evaluation (Signed)
Anesthesia Post Note  Patient: Annette Hampton  Procedure(s) Performed: TRANSESOPHAGEAL ECHOCARDIOGRAM (TEE) (N/A ) CARDIOVERSION (N/A )     Patient location during evaluation: PACU Anesthesia Type: MAC Level of consciousness: awake and alert Pain management: pain level controlled Vital Signs Assessment: post-procedure vital signs reviewed and stable Respiratory status: spontaneous breathing, nonlabored ventilation, respiratory function stable and patient connected to nasal cannula oxygen Cardiovascular status: stable and blood pressure returned to baseline Postop Assessment: no apparent nausea or vomiting Anesthetic complications: no   No complications documented.  Last Vitals:  Vitals:   07/17/20 0820 07/17/20 0829  BP: 95/60 97/60  Pulse: (!) 57 (!) 55  Resp: (!) 23 15  Temp:    SpO2: 100% 100%    Last Pain:  Vitals:   07/17/20 0829  TempSrc:   PainSc: 0-No pain                 Leiana Rund COKER

## 2020-07-17 NOTE — Interval H&P Note (Signed)
History and Physical Interval Note:  07/17/2020 7:35 AM  Annette Hampton  has presented today for surgery, with the diagnosis of Atrial flutter.  The various methods of treatment have been discussed with the patient and family. After consideration of risks, benefits and other options for treatment, the patient has consented to  Procedure(s): TRANSESOPHAGEAL ECHOCARDIOGRAM (TEE) (N/A) CARDIOVERSION (N/A) as a surgical intervention.  The patient's history has been reviewed, patient examined, no change in status, stable for surgery.  I have reviewed the patient's chart and labs.  Questions were answered to the patient's satisfaction.     Anurag Scarfo J Charlotta Lapaglia

## 2020-07-17 NOTE — Transfer of Care (Signed)
Immediate Anesthesia Transfer of Care Note  Patient: Annette Hampton  Procedure(s) Performed: TRANSESOPHAGEAL ECHOCARDIOGRAM (TEE) (N/A ) CARDIOVERSION (N/A )  Patient Location: Endoscopy Unit  Anesthesia Type:MAC  Level of Consciousness: lethargic and responds to stimulation  Airway & Oxygen Therapy: Patient Spontanous Breathing  Post-op Assessment: Report given to RN  Post vital signs: Reviewed and stable  Last Vitals:  Vitals Value Taken Time  BP    Temp    Pulse 55 07/17/20 0806  Resp 23 07/17/20 0806  SpO2 98 % 07/17/20 0806  Vitals shown include unvalidated device data.  Last Pain:  Vitals:   07/17/20 0654  TempSrc: Oral  PainSc: 0-No pain         Complications: No complications documented.

## 2020-07-17 NOTE — Discharge Instructions (Signed)
TEE  YOU HAD AN CARDIAC PROCEDURE TODAY: Refer to the procedure report and other information in the discharge instructions given to you for any specific questions about what was found during the examination. If this information does not answer your questions, please call [facility] office at [phone] to clarify.   DIET: Your first meal following the procedure should be a light meal and then it is ok to progress to your normal diet. A half-sandwich or bowl of soup is an example of a good first meal. Heavy or fried foods are harder to digest and may make you feel nauseous or bloated. Drink plenty of fluids but you should avoid alcoholic beverages for 24 hours. If you had a esophageal dilation, please see attached instructions for diet.   ACTIVITY: Your care partner should take you home directly after the procedure. You should plan to take it easy, moving slowly for the rest of the day. You can resume normal activity the day after the procedure however YOU SHOULD NOT DRIVE, use power tools, machinery or perform tasks that involve climbing or major physical exertion for 24 hours (because of the sedation medicines used during the test).   SYMPTOMS TO REPORT IMMEDIATELY: A cardiologist can be reached at any hour. Please call [phone] for any of the following symptoms:  Vomiting of blood or coffee ground material  New, significant abdominal pain  New, significant chest pain or pain under the shoulder blades  Painful or persistently difficult swallowing  New shortness of breath  Black, tarry-looking or red, bloody stools  FOLLOW UP:  Please also call with any specific questions about appointments or follow up tests.   Electrical Cardioversion Electrical cardioversion is the delivery of a jolt of electricity to restore a normal rhythm to the heart. A rhythm that is too fast or is not regular keeps the heart from pumping well. In this procedure, sticky patches or metal paddles are placed on  the chest to deliver electricity to the heart from a device.  Follow these instructions at home:  Do not drive for 24 hours if you were given a sedative during your procedure.  Take over-the-counter and prescription medicines only as told by your health care provider.  Ask your health care provider how to check your pulse. Check it often.  Rest for 48 hours after the procedure or as told by your health care provider.  Avoid or limit your caffeine use as told by your health care provider.  Keep all follow-up visits as told by your health care provider. This is important. Contact a health care provider if:  You feel like your heart is beating too quickly or your pulse is not regular.  You have a serious muscle cramp that does not go away. Get help right away if:  You have discomfort in your chest.  You are dizzy or you feel faint.  You have trouble breathing or you are short of breath.  Your speech is slurred.  You have trouble moving an arm or leg on one side of your body.  Your fingers or toes turn cold or blue. Summary  Electrical cardioversion is the delivery of a jolt of electricity to restore a normal rhythm to the heart.  This procedure may be done right away in an emergency or may be a scheduled procedure if the condition is not an emergency.  Generally, this is a safe procedure.  After the procedure, check your pulse often as told by your health care provider. This   information is not intended to replace advice given to you by your health care provider. Make sure you discuss any questions you have with your health care provider. Document Revised: 06/24/2019 Document Reviewed: 06/24/2019 Elsevier Patient Education  2020 Elsevier Inc.  

## 2020-07-17 NOTE — Progress Notes (Signed)
  Echocardiogram Echocardiogram Transesophageal has been performed.  Gerda Diss 07/17/2020, 8:31 AM

## 2020-07-17 NOTE — CV Procedure (Signed)
TEE: Under deep sedation, TEE was performed without complications: LV: Moderate global hypokinesis. LVEF 30-35% RV: Mildly reduced systolic function LA: Normal. Left atrial appendage: Normal without thrombus. Normal function. Inter atrial septum is intact without defect.  RA: Normal  MV: Mod MR TV: Mild TR AV: Normal. No AI or AS. PV: Normal. No PI.  Thoracic and ascending aorta: Normal without significant plaque or atheromatous changes.  Deep sedation protocol was followed, see anesthesiology note.  Direct current cardioversion:  Indication symptomatic: Atrial flutter  Procedure: Under deep sedation administered and monitored by anesthesiology, synchronized direct current cardioversion performed. Patient was delivered with 50 Joules of electricity X 1 with success to NSR. Patient tolerated the procedure well. No immediate complication noted.   Elder Negus, MD [facility]. [place] Pager: [phone] Office: [phone] If no answer Cell [phone]

## 2020-07-17 NOTE — Anesthesia Procedure Notes (Signed)
Procedure Name: MAC Date/Time: 07/17/2020 7:39 AM Performed by: Barrington Ellison, CRNA Pre-anesthesia Checklist: Patient identified, Emergency Drugs available, Suction available and Patient being monitored Patient Re-evaluated:Patient Re-evaluated prior to induction Oxygen Delivery Method: Nasal cannula

## 2020-07-17 NOTE — Anesthesia Preprocedure Evaluation (Signed)
Anesthesia Evaluation  Patient identified by MRN, date of birth, ID band Patient awake    Reviewed: Allergy & Precautions, NPO status , Patient's Chart, lab work & pertinent test results  Airway Mallampati: II  TM Distance: >3 FB Neck ROM: Full    Dental  (+) Teeth Intact, Dental Advisory Given   Pulmonary former smoker,    breath sounds clear to auscultation       Cardiovascular  Rhythm:Regular Rate:Normal     Neuro/Psych    GI/Hepatic   Endo/Other    Renal/GU      Musculoskeletal   Abdominal   Peds  Hematology   Anesthesia Other Findings   Reproductive/Obstetrics                             Anesthesia Physical Anesthesia Plan  ASA: III  Anesthesia Plan: MAC   Post-op Pain Management:    Induction:   PONV Risk Score and Plan:   Airway Management Planned: Nasal Cannula and Natural Airway  Additional Equipment:   Intra-op Plan:   Post-operative Plan:   Informed Consent: I have reviewed the patients History and Physical, chart, labs and discussed the procedure including the risks, benefits and alternatives for the proposed anesthesia with the patient or authorized representative who has indicated his/her understanding and acceptance.     Dental advisory given  Plan Discussed with: Anesthesiologist and CRNA  Anesthesia Plan Comments:         Anesthesia Quick Evaluation

## 2020-07-17 NOTE — H&P (Signed)
OV 8/12 copied for documentation     Patient referred by No ref. provider found for tachycardia  Subjective:   Annette Hampton, female    DOB: 07-11-1951, 69 y.o.   MRN: [id number]   C/C: Atrial flutter  HPI  69 y.o. Caucasian female with tachycardia.  Patient has no baseline medical problems, other than osteoporosis.  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 She denies any palpitations symptoms. Also denies orthopnea, PND, leg edema.  Patient was at her routine PCP visit on 07/13/2020. EKG then and now showed tachycardia >140 bpm, details below.   She occasionally drinks a beer, drinks 2 cups of coffee everyday. She has not performed any strenuous exercise since her visit with her PCP. She was started on metoprolol xL 25 mg daily by PCP Dr. Denton Ar.     Past Medical History:  Diagnosis Date  . Tachycardia      Past Surgical History:  Procedure Laterality Date  . TONSILLECTOMY       Social History   Tobacco Use  Smoking Status Former Smoker  . Packs/day: 0.25  . Years: 3.00  . Pack years: 0.75  . Types: Cigarettes  . Quit date: 20  . Years since quitting: 26.6  Smokeless Tobacco Former Systems developer    Social History   Substance and Sexual Activity  Alcohol Use Yes   Comment: occasional beer     Family History  Problem Relation Age of Onset  . Parkinson's disease Father      No current facility-administered medications on file prior to encounter.   Current Outpatient Medications on File Prior to Encounter  Medication Sig Dispense Refill  . alendronate (FOSAMAX) 70 MG tablet Take 70 mg by mouth once a week.    Marland Kitchen apixaban (ELIQUIS) 5 MG TABS tablet Take 1 tablet (5 mg total) by mouth 2 (two) times daily. 60 tablet 2  . CALCIUM CITRATE PO Take by mouth.    . metoprolol succinate (TOPROL-XL) 25 MG 24 hr tablet Take 50 mg by mouth daily.    . Multiple  Vitamin (MULTI-VITAMIN DAILY PO) Take by mouth.      Cardiovascular and other pertinent studies:  EKG 07/16/2020: Typical atrial flutter 2: 1 conduction RVR 145 bpm Nonspecific ST-T changes   EKG 07/13/2020: Typical atrial flutter 144 bpm  2: 1 AV conduction    Recent labs: 07/13/2020: Glucose 89, BUN/Cr 17/1.13. EGFR 48/58. Na/K 141/4.4. T.bili 1.5. Rest of the CMP normal H/H 16/46. MCV 101. Platelets 194 Chol 191, TG 73, HDL 78, LDL 100 TSH 2.8 normal    Review of Systems  Cardiovascular: Negative for chest pain, dyspnea on exertion, leg swelling, palpitations and syncope.         Vitals:   07/17/20 0654  BP: 110/60  Pulse: 91  Resp: (!) 25  Temp: 98.3 F (36.8 C)  SpO2: 97%     There is no height or weight on file to calculate BMI. There were no vitals filed for this visit.   Objective:   Physical Exam Vitals and nursing note reviewed.  Constitutional:      General: She is not in acute distress. Neck:     Vascular: No JVD.  Cardiovascular:     Rate and Rhythm: Regular rhythm. Tachycardia present.     Pulses: Normal pulses.     Heart sounds: Normal heart sounds. No murmur heard.  Pulmonary:     Effort: Pulmonary effort is normal.     Breath sounds: Normal breath sounds. No wheezing or rales.          Assessment & Recommendations:    70 y/o Caucasian female with osteoporosis, now with new onset atrial flutter with RVR   Atrial flutter: Typical atrial flutter, present acute since 07/13/2020.  Patient does not have any specific symptoms related to it, thus making estimation of acute onset difficult. RVR in 140s while on metoprolol succinate 25 mg daily. Recommend increasing to 50 mg daily. CHA2DS2-VASc score 2, annual stroke risk 2%. Recommend eliquis 5 mg bid for now But she does not have any symptoms, RVR in 140s is not sustainable. Recommend TEE prior to cardioversion and referral to EP for consideration for ablation. After cardioversion,  will consider placing on event monitor to look for recurrence of atrial flutter.  Recent labs with PCP, detailed above.  Rapid Covid testing   Thank you for referring the patient to [place]. Please feel free to contact with any questions.  Nigel Mormon, MD [facility]. [place] Pager: [phone] Office: [phone]

## 2020-07-19 ENCOUNTER — Encounter (HOSPITAL_COMMUNITY): Payer: Self-pay | Admitting: Cardiology

## 2020-07-21 ENCOUNTER — Telehealth: Payer: Self-pay

## 2020-07-21 NOTE — Telephone Encounter (Signed)
I expect the heart rates to be low, now that her heart is in normal rhythm and she is on metoprolol succinate 25 mg. I would continue the same for now. Will discuss more during office visit on 8/20.  Thanks MJP

## 2020-07-21 NOTE — Telephone Encounter (Signed)
Received a call from patient in regards to recent heart rate readings. Patient stated that she had a Cardioversion on 8/13 and recent heart rates have been ranging 50-52 while at rest. Patient is not experiencing any dizziness, SOB, or CP. Patient would just like to clarify that these readings are normal. Please advise. Thanks!

## 2020-07-21 NOTE — Telephone Encounter (Signed)
Spoke with patient. Patient voiced understanding.

## 2020-07-22 ENCOUNTER — Telehealth: Payer: Self-pay | Admitting: Cardiology

## 2020-07-22 DIAGNOSIS — I483 Typical atrial flutter: Secondary | ICD-10-CM

## 2020-07-22 NOTE — Telephone Encounter (Signed)
Patient called around 7:20 PM this evening. Her HR increased to 150 bpm with light walk on treadmill, and persisted even 30 min after resting. She feels palpitations, but denies any other associated symptoms. I suspect she is back in atrial flutter, after having had successful cardioversion on 8/12. I asked her to increase metoprolol succinate from 25 mg to 50 mg, continue eliquis. She is awaiting EP consult for consideration for ablation. She is scheduled to see Dr. Ladona Ridgel on 8/31.   In the meantime, I will attempt another cardioversion. Given possibility of ablation, I will hold of starting any antiarrhythmic therapy at this time. Given recent TEE on 8/12 and uninterrupted use of eliquis, I do not think she will need repeat TEE. I will follow up after checking availability for an elective cardioversion.   Elder Negus, MD Pager: [phone] Office: [phone]

## 2020-07-23 ENCOUNTER — Other Ambulatory Visit (HOSPITAL_COMMUNITY)
Admission: RE | Admit: 2020-07-23 | Discharge: 2020-07-23 | Disposition: A | Discharge status: Home / Self Care | Payer: Medicare Other | Source: Ambulatory Visit | Attending: Cardiology | Admitting: Cardiology

## 2020-07-23 DIAGNOSIS — Z01812 Encounter for preprocedural laboratory examination: Secondary | ICD-10-CM | POA: Insufficient documentation

## 2020-07-23 DIAGNOSIS — Z20822 Contact with and (suspected) exposure to covid-19: Secondary | ICD-10-CM | POA: Diagnosis not present

## 2020-07-23 LAB — SARS CORONAVIRUS 2 (TAT 6-24 HRS): SARS Coronavirus 2: NEGATIVE

## 2020-07-24 ENCOUNTER — Encounter (HOSPITAL_COMMUNITY): Payer: Self-pay | Admitting: Cardiology

## 2020-07-24 ENCOUNTER — Ambulatory Visit: Payer: Medicare Other | Admitting: Cardiology

## 2020-07-24 ENCOUNTER — Ambulatory Visit (HOSPITAL_COMMUNITY): Payer: Medicare Other | Admitting: Certified Registered Nurse Anesthetist

## 2020-07-24 ENCOUNTER — Other Ambulatory Visit: Payer: Self-pay | Admitting: Cardiology

## 2020-07-24 ENCOUNTER — Encounter (HOSPITAL_COMMUNITY)
Admission: RE | Disposition: A | Discharge status: Home / Self Care | Payer: Self-pay | Source: Home / Self Care | Attending: Cardiology

## 2020-07-24 ENCOUNTER — Ambulatory Visit (HOSPITAL_COMMUNITY)
Admission: RE | Admit: 2020-07-24 | Discharge: 2020-07-24 | Disposition: A | Discharge status: Home / Self Care | Payer: Medicare Other | Attending: Cardiology | Admitting: Cardiology

## 2020-07-24 ENCOUNTER — Other Ambulatory Visit: Payer: Self-pay

## 2020-07-24 DIAGNOSIS — Z79899 Other long term (current) drug therapy: Secondary | ICD-10-CM | POA: Diagnosis not present

## 2020-07-24 DIAGNOSIS — Z7901 Long term (current) use of anticoagulants: Secondary | ICD-10-CM | POA: Insufficient documentation

## 2020-07-24 DIAGNOSIS — I483 Typical atrial flutter: Secondary | ICD-10-CM | POA: Insufficient documentation

## 2020-07-24 DIAGNOSIS — Z87891 Personal history of nicotine dependence: Secondary | ICD-10-CM | POA: Insufficient documentation

## 2020-07-24 DIAGNOSIS — M810 Age-related osteoporosis without current pathological fracture: Secondary | ICD-10-CM | POA: Insufficient documentation

## 2020-07-24 HISTORY — PX: CARDIOVERSION: SHX1299

## 2020-07-24 LAB — POCT I-STAT, CHEM 8
BUN: 17 mg/dL (ref 8–23)
Calcium, Ion: 1.26 mmol/L (ref 1.15–1.40)
Chloride: 105 mmol/L (ref 98–111)
Creatinine, Ser: 1 mg/dL (ref 0.44–1.00)
Glucose, Bld: 90 mg/dL (ref 70–99)
HCT: 49 % — ABNORMAL HIGH (ref 36.0–46.0)
Hemoglobin: 16.7 g/dL — ABNORMAL HIGH (ref 12.0–15.0)
Potassium: 4.3 mmol/L (ref 3.5–5.1)
Sodium: 143 mmol/L (ref 135–145)
TCO2: 27 mmol/L (ref 22–32)

## 2020-07-24 SURGERY — CARDIOVERSION
Anesthesia: General

## 2020-07-24 MED ORDER — LIDOCAINE 2% (20 MG/ML) 5 ML SYRINGE
INTRAMUSCULAR | Status: DC | PRN
  Administered 2020-07-24: 50 mg via INTRAVENOUS

## 2020-07-24 MED ORDER — DILTIAZEM HCL ER COATED BEADS 120 MG PO CP24: 120.0000 mg | ORAL_CAPSULE | Freq: Every day | ORAL | 1 refills | Status: DC

## 2020-07-24 MED ORDER — SODIUM CHLORIDE 0.9 % IV SOLN: INTRAVENOUS | Status: DC

## 2020-07-24 MED ORDER — METOPROLOL SUCCINATE ER 25 MG PO TB24: 50.0000 mg | ORAL_TABLET | Freq: Every day | ORAL | Status: DC

## 2020-07-24 MED ORDER — PROPOFOL 10 MG/ML IV BOLUS
INTRAVENOUS | Status: DC | PRN
  Administered 2020-07-24: 60 mg via INTRAVENOUS
  Administered 2020-07-24: 10 mg via INTRAVENOUS

## 2020-07-24 NOTE — CV Procedure (Addendum)
Direct current cardioversion:  Indication symptomatic: Symptomatic atrial flutter  Procedure: Under deep sedation administered and monitored by anesthesiology, synchronized direct current cardioversion performed. Patient was delivered with 50, 100, 150 Joules of electricity X 3 without conversion to NSR. Patient converted to atrial fibrillation with first shock, and remained in Afib with rate around 90-100 bpm with subsequent shocks.  No immediate complication noted.   Continue metoprolol succinate at 50 mg daily. Added diltiazem at 120 mg daily.   While her EF was reduced while in flutter, I do not think she is in decompensated heart failure and should be able to tolerate diltiazem for rate control without any significant negative inotropic effect. Continue eliquis 5 mg bid.   Upcoming appt with Dr. Ladona Ridgel on 8/31 to discuss ablation.  Elder Negus, MD [facility]. [place] Pager: [phone] Office: [phone] If no answer Cell [phone]

## 2020-07-24 NOTE — Anesthesia Preprocedure Evaluation (Addendum)
Anesthesia Evaluation  Patient identified by MRN, date of birth, ID band Patient awake    Reviewed: Allergy & Precautions, NPO status , Patient's Chart, lab work & pertinent test results, reviewed documented beta blocker date and time   Airway Mallampati: I  TM Distance: >3 FB Neck ROM: Full    Dental no notable dental hx. (+) Teeth Intact, Caps   Pulmonary former smoker,    Pulmonary exam normal breath sounds clear to auscultation       Cardiovascular negative cardio ROS  + dysrhythmias Atrial Fibrillation  Rhythm:Irregular Rate:Normal     Neuro/Psych negative neurological ROS  negative psych ROS   GI/Hepatic negative GI ROS, Neg liver ROS,   Endo/Other  Osteoporosis  Renal/GU negative Renal ROS  negative genitourinary   Musculoskeletal negative musculoskeletal ROS (+)   Abdominal   Peds  Hematology Eliquis therapy- last dose this am   Anesthesia Other Findings   Reproductive/Obstetrics                           Anesthesia Physical Anesthesia Plan  ASA: III  Anesthesia Plan: General   Post-op Pain Management:    Induction: Intravenous  PONV Risk Score and Plan: 3 and Treatment may vary due to age or medical condition  Airway Management Planned: Natural Airway and Mask  Additional Equipment: None  Intra-op Plan:   Post-operative Plan: Extubation in OR  Informed Consent: I have reviewed the patients History and Physical, chart, labs and discussed the procedure including the risks, benefits and alternatives for the proposed anesthesia with the patient or authorized representative who has indicated his/her understanding and acceptance.     Dental advisory given  Plan Discussed with: CRNA and Anesthesiologist  Anesthesia Plan Comments:        Anesthesia Quick Evaluation

## 2020-07-24 NOTE — Addendum Note (Signed)
Addendum  created 07/24/20 0902 by Mal Amabile, MD   Clinical Note Signed

## 2020-07-24 NOTE — Anesthesia Postprocedure Evaluation (Signed)
Anesthesia Post Note  Patient: Annette Hampton  Procedure(s) Performed: CARDIOVERSION (N/A )     Patient location during evaluation: PACU Anesthesia Type: General Level of consciousness: awake and alert and oriented Pain management: pain level controlled Vital Signs Assessment: post-procedure vital signs reviewed and stable Respiratory status: spontaneous breathing, nonlabored ventilation and respiratory function stable Cardiovascular status: blood pressure returned to baseline and stable Postop Assessment: no apparent nausea or vomiting Anesthetic complications: no   No complications documented.  Last Vitals:  Vitals:   07/24/20 0857 07/24/20 0858  BP: 122/67   Pulse: 93 (!) 131  Resp: (!) 24 (!) 26  Temp:    SpO2: 98% 99%    Last Pain:  Vitals:   07/24/20 0808  TempSrc: Oral  PainSc: 0-No pain                 Breandan People A.

## 2020-07-24 NOTE — Discharge Instructions (Signed)
Electrical Cardioversion  Follow these instructions at home:  Do not drive for 24 hours if you were given a sedative during your procedure.  Take over-the-counter and prescription medicines only as told by your health care provider.  Ask your health care provider how to check your pulse. Check it often.  Rest for 48 hours after the procedure or as told by your health care provider.  Avoid or limit your caffeine use as told by your health care provider.  Keep all follow-up visits as told by your health care provider. This is important. Contact a health care provider if:  You feel like your heart is beating too quickly or your pulse is not regular.  You have a serious muscle cramp that does not go away. Get help right away if:  You have discomfort in your chest.  You are dizzy or you feel faint.  You have trouble breathing or you are short of breath.  Your speech is slurred.  You have trouble moving an arm or leg on one side of your body.  Your fingers or toes turn cold or blue. Summary  Electrical cardioversion is the delivery of a jolt of electricity to restore a normal rhythm to the heart.  This procedure may be done right away in an emergency or may be a scheduled procedure if the condition is not an emergency.  Generally, this is a safe procedure.  After the procedure, check your pulse often as told by your health care provider. This information is not intended to replace advice given to you by your health care provider. Make sure you discuss any questions you have with your health care provider. Document Revised: 06/24/2019 Document Reviewed: 06/24/2019 Elsevier Patient Education  2020 Elsevier Inc.  

## 2020-07-24 NOTE — Anesthesia Procedure Notes (Signed)
Procedure Name: MAC Date/Time: 07/24/2020 8:51 AM Performed by: Colin Benton, CRNA Pre-anesthesia Checklist: Patient identified, Emergency Drugs available, Suction available and Patient being monitored Patient Re-evaluated:Patient Re-evaluated prior to induction Oxygen Delivery Method: Ambu bag Preoxygenation: Pre-oxygenation with 100% oxygen Induction Type: IV induction Ventilation: Mask ventilation without difficulty Placement Confirmation: positive ETCO2 Dental Injury: Teeth and Oropharynx as per pre-operative assessment

## 2020-07-24 NOTE — Interval H&P Note (Signed)
History and Physical Interval Note:  07/24/2020 8:42 AM  Annette Hampton  has presented today for surgery, with the diagnosis of AFIB.  The various methods of treatment have been discussed with the patient and family. After consideration of risks, benefits and other options for treatment, the patient has consented to  Procedure(s): CARDIOVERSION (N/A) as a surgical intervention.  The patient's history has been reviewed, patient examined, no change in status, stable for surgery.  I have reviewed the patient's chart and labs.  Questions were answered to the patient's satisfaction.     [name]

## 2020-07-24 NOTE — Transfer of Care (Signed)
Immediate Anesthesia Transfer of Care Note  Patient: Annette Hampton  Procedure(s) Performed: CARDIOVERSION (N/A )  Patient Location: Endoscopy Unit  Anesthesia Type:MAC  Level of Consciousness: drowsy  Airway & Oxygen Therapy: Patient Spontanous Breathing  Post-op Assessment: Report given to RN and Post -op Vital signs reviewed and stable  Post vital signs: Reviewed and stable  Last Vitals:  Vitals Value Taken Time  BP 122/67 07/24/20 0857  Temp    Pulse 131 07/24/20 0858  Resp 26 07/24/20 0858  SpO2 99 % 07/24/20 0858    Last Pain:  Vitals:   07/24/20 0808  TempSrc: Oral  PainSc: 0-No pain         Complications: No complications documented.

## 2020-07-24 NOTE — H&P (Signed)
OV 8/12 copied for documentation     Patient referred by No ref. provider found for tachycardia  Subjective:   Annette Hampton, female    DOB: 12/23/1950, 69 y.o.   MRN: [id number]   C/C: Atrial flutter  HPI  69 y/o Caucasian female with osteoporosis, with atrial flutter with RVR   Patient underwent TEE cardioversion on 8/12, back in atrial flutter with RVR now. She has been on eliquis.     Past Medical History:  Diagnosis Date  . Tachycardia      Past Surgical History:  Procedure Laterality Date  . CARDIOVERSION N/A 07/17/2020   Procedure: CARDIOVERSION;  Surgeon: Nigel Mormon, MD;  Location: [facility] ENDOSCOPY;  Service: Cardiovascular;  Laterality: N/A;  . TEE WITHOUT CARDIOVERSION N/A 07/17/2020   Procedure: TRANSESOPHAGEAL ECHOCARDIOGRAM (TEE);  Surgeon: Nigel Mormon, MD;  Location: [facility] ENDOSCOPY;  Service: Cardiovascular;  Laterality: N/A;  . TONSILLECTOMY       Social History   Tobacco Use  Smoking Status Former Smoker  . Packs/day: 0.25  . Years: 3.00  . Pack years: 0.75  . Types: Cigarettes  . Quit date: 22  . Years since quitting: 16.6  Smokeless Tobacco Former Systems developer    Social History   Substance and Sexual Activity  Alcohol Use Yes   Comment: occasional beer     Family History  Problem Relation Age of Onset  . Parkinson's disease Father      No current facility-administered medications on file prior to encounter.   Current Outpatient Medications on File Prior to Encounter  Medication Sig Dispense Refill  . alendronate (FOSAMAX) 70 MG tablet Take 70 mg by mouth every Sunday.     Marland Kitchen apixaban (ELIQUIS) 5 MG TABS tablet Take 1 tablet (5 mg total) by mouth 2 (two) times daily. 60 tablet 2  . ibuprofen (ADVIL) 200 MG tablet Take 400 mg by mouth every 6 (six) hours as needed for headache.    . metoprolol succinate (TOPROL-XL) 25 MG 24 hr tablet Take 1 tablet (25 mg total) by mouth daily. (Patient taking differently: Take 50 mg by  mouth daily. ) 1 tablet 0  . Multiple Vitamin (MULTI-VITAMIN DAILY PO) Take 2 tablets by mouth daily.     Marland Kitchen CALCIUM CITRATE PO Take 2 tablets by mouth daily.       Cardiovascular and other pertinent studies:  EKG 07/16/2020: Typical atrial flutter 2: 1 conduction RVR 145 bpm Nonspecific ST-T changes   EKG 07/13/2020: Typical atrial flutter 144 bpm  2: 1 AV conduction    Recent labs: 07/13/2020: Glucose 89, BUN/Cr 17/1.13. EGFR 48/58. Na/K 141/4.4. T.bili 1.5. Rest of the CMP normal H/H 16/46. MCV 101. Platelets 194 Chol 191, TG 73, HDL 78, LDL 100 TSH 2.8 normal    Review of Systems  Cardiovascular: Negative for chest pain, dyspnea on exertion, leg swelling, palpitations and syncope.         Vitals:   07/24/20 0808  BP: 109/84  Pulse: (!) 139  Resp: (!) 22  Temp: 97.9 F (36.6 C)  SpO2: 99%     Body mass index is 22.89 kg/m. Filed Weights   07/24/20 0808  Weight: 70.3 kg     Objective:   Physical Exam Vitals and nursing note reviewed.  Constitutional:      General: She is not in acute distress. Neck:     Vascular: No JVD.  Cardiovascular:     Rate and Rhythm: Regular rhythm. Tachycardia present.  Pulses: Normal pulses.     Heart sounds: Normal heart sounds. No murmur heard.   Pulmonary:     Effort: Pulmonary effort is normal.     Breath sounds: Normal breath sounds. No wheezing or rales.          Assessment & Recommendations:    69 y/o Caucasian female with osteoporosis, with atrial flutter with RVR   Atrial flutter: Plan for cardioversion.  Nigel Mormon, MD [facility]. [facility] Pager: [phone] Office: [phone]

## 2020-07-26 ENCOUNTER — Encounter (HOSPITAL_COMMUNITY): Payer: Self-pay | Admitting: Cardiology

## 2020-07-30 ENCOUNTER — Ambulatory Visit: Payer: Medicare Other | Admitting: Cardiology

## 2020-07-30 ENCOUNTER — Encounter: Payer: Self-pay | Admitting: Cardiology

## 2020-07-30 ENCOUNTER — Other Ambulatory Visit: Payer: Self-pay

## 2020-07-30 ENCOUNTER — Ambulatory Visit: Payer: Medicare Other

## 2020-07-30 VITALS — BP 107/55 | HR 49 | Resp 16 | Ht 69.0 in | Wt 161.4 lb

## 2020-07-30 DIAGNOSIS — I483 Typical atrial flutter: Secondary | ICD-10-CM

## 2020-07-30 DIAGNOSIS — I4891 Unspecified atrial fibrillation: Secondary | ICD-10-CM

## 2020-07-30 NOTE — Progress Notes (Signed)
  Patient referred by Husain, Karrar, MD for tachycardia  Subjective:   Annette Hampton, female    DOB: 10/09/1951, 69 y.o.   MRN: [id number]   Chief Complaint  Patient presents with  . Results    Echocardiogram  . Follow-up     HPI  69 y/o Caucasian female with osteoporosis, paroxysmal atrial flutter  After initial diagnosis of typical atrial flutter, patient underwent successful cardioversion on 07/17/2020 with conversion to sinus rhythm.  She was continued on metoprolol succinate 25 mg daily thereafter.  However, few days later, patient had recurrence of atrial flutter.  At this point, patient had been on Eliquis with recent TEE showing no thrombus.  Therefore, she underwent repeat cardioversion on 07/24/2020.  After this cardioversion, she had appearance of atrial fibrillation.  I recommended increasing metoprolol succinate to 50 mg daily, and added diltiazem 120 mg daily.  Patient is here for follow-up today.  She reports that her heart rate went back to fifties 3 days ago.  She does not have any symptoms at this time.  She underwent echocardiogram today, details below.  Patient has appointment to see Dr. Greg Taylor on 8/31 for consideration for ablation.    Current Outpatient Medications on File Prior to Visit  Medication Sig Dispense Refill  . alendronate (FOSAMAX) 70 MG tablet Take 70 mg by mouth every Sunday.     . apixaban (ELIQUIS) 5 MG TABS tablet Take 1 tablet (5 mg total) by mouth 2 (two) times daily. 60 tablet 2  . CALCIUM CITRATE PO Take 2 tablets by mouth daily.     . diltiazem (CARDIZEM CD) 120 MG 24 hr capsule TAKE 1 CAPSULE(120 MG) BY MOUTH DAILY 90 capsule 1  . ibuprofen (ADVIL) 200 MG tablet Take 400 mg by mouth every 6 (six) hours as needed for headache.    . metoprolol succinate (TOPROL-XL) 25 MG 24 hr tablet Take 2 tablets (50 mg total) by mouth daily.    . Multiple Vitamin (MULTI-VITAMIN DAILY PO) Take 2 tablets by mouth daily.      No current  facility-administered medications on file prior to visit.    Cardiovascular and other pertinent studies:  EKG 07/30/2020: Sinus bradycardia 50 bpm Left atrial enlargement  Echocardiogram 07/30/2020: Left ventricle cavity is normal in size and wall thickness. Normal global wall motion. Normal LV systolic function with EF 55%. Normal diastolic filling pattern.  Mild biatrial dilatation. Mild (Grade I) mitral regurgitation. Mild tricuspid regurgitation. Estimated pulmonary artery systolic pressure 32 mmHg.  Cardioversion 07/24/2020: Atrial flutter-->Afib  TEE/cardioversion 07/17/2020 Atrial flutter-->sinus rhythm  EKG 07/16/2020: Typical atrial flutter 2: 1 conduction RVR 145 bpm Nonspecific ST-T changes  Recent labs: 07/13/2020: Glucose 89, BUN/Cr 17/1.13. EGFR 48/58. Na/K 141/4.4. T.bili 1.5. Rest of the CMP normal H/H 16/46. MCV 101. Platelets 194 Chol 191, TG 73, HDL 78, LDL 100 TSH 2.8 normal    Review of Systems  Cardiovascular: Negative for chest pain, dyspnea on exertion, leg swelling, palpitations and syncope.         Vitals:   07/30/20 1527  BP: (!) 107/55  Pulse: (!) 49  Resp: 16     Body mass index is 23.83 kg/m. Filed Weights   07/30/20 1527  Weight: 161 lb 6.4 oz (73.2 kg)     Objective:   Physical Exam Vitals and nursing note reviewed.  Constitutional:      General: She is not in acute distress. Neck:     Vascular: No JVD.  Cardiovascular:       Rate and Rhythm: Regular rhythm. Tachycardia present.     Pulses: Normal pulses.     Heart sounds: Normal heart sounds. No murmur heard.   Pulmonary:     Effort: Pulmonary effort is normal.     Breath sounds: Normal breath sounds. No wheezing or rales.          Assessment & Recommendations:    69 y/o Caucasian female with osteoporosis, paroxysmal atrial flutter  Atrial flutter: Typical atrial flutter with successful cardioversion 07/17/2020, conversion to A. fib after cardioversion on  07/24/2020.  However, patient is back in sinus rhythm today with sinus bradycardia on metoprolol succinate 50 mg and diltiazem 120 mg daily.  Echocardiogram shows normal ejection fraction. I believe her primary issue is atrial flutter and brief conversion to atrial fibrillation after second cardioversion was transient. CHA2DS2-VASc score 2, annual stroke risk 2%. Recommend eliquis 5 mg bid for now. I am optimistic that she may not need long-term anticoagulation, however I will defer this to Dr. Tanna Furry discretion.  Keep appointment with him on 8/31 for consideration for ablation.  Nigel Mormon, MD [facility] Pager: [phone] Office: [phone]

## 2020-08-03 ENCOUNTER — Ambulatory Visit: Payer: Medicare Other | Admitting: Cardiology

## 2020-08-04 ENCOUNTER — Other Ambulatory Visit: Payer: Self-pay

## 2020-08-04 ENCOUNTER — Encounter: Payer: Self-pay | Admitting: Internal Medicine

## 2020-08-04 ENCOUNTER — Ambulatory Visit (INDEPENDENT_AMBULATORY_CARE_PROVIDER_SITE_OTHER): Payer: Medicare Other | Admitting: Internal Medicine

## 2020-08-04 VITALS — BP 112/70 | HR 60 | Ht 69.0 in | Wt 157.0 lb

## 2020-08-04 DIAGNOSIS — I483 Typical atrial flutter: Secondary | ICD-10-CM

## 2020-08-04 MED ORDER — FLECAINIDE ACETATE 50 MG PO TABS: 50.0000 mg | ORAL_TABLET | Freq: Two times a day (BID) | ORAL | 3 refills | Status: DC

## 2020-08-04 NOTE — Progress Notes (Signed)
HPI Annette Hampton is referred by Dr. Jonette Eva for evaluation of atrial fib and flutter. She is a pleasant 69 yo woman with persistent atrial fib and flutter. She has been cardioverted twice and placed on cardizem and toprol. She has minimal palpitations and could only tell that she was out of rhythm when her fit bit noted that she was 120/min. She has not had syncope. She denies ETOH or caffeine excess. She exercises regularly and cannot tell if she is out of rhythm when she exercises. No Known Allergies   Current Outpatient Medications  Medication Sig Dispense Refill  . alendronate (FOSAMAX) 70 MG tablet Take 70 mg by mouth every Sunday.     Marland Kitchen apixaban (ELIQUIS) 5 MG TABS tablet Take 1 tablet (5 mg total) by mouth 2 (two) times daily. 60 tablet 2  . CALCIUM CITRATE PO Take 2 tablets by mouth daily.     Marland Kitchen ibuprofen (ADVIL) 200 MG tablet Take 400 mg by mouth every 6 (six) hours as needed for headache.    . metoprolol succinate (TOPROL-XL) 25 MG 24 hr tablet Take 2 tablets (50 mg total) by mouth daily.    . Multiple Vitamin (MULTI-VITAMIN DAILY PO) Take 2 tablets by mouth daily.     . flecainide (TAMBOCOR) 50 MG tablet Take 1 tablet (50 mg total) by mouth 2 (two) times daily. 180 tablet 3   No current facility-administered medications for this visit.     Past Medical History:  Diagnosis Date  . Tachycardia     ROS:   All systems reviewed and negative except as noted in the HPI.   Past Surgical History:  Procedure Laterality Date  . CARDIOVERSION N/A 07/17/2020   Procedure: CARDIOVERSION;  Surgeon: Elder Negus, MD;  Location: [facility];  Service: Cardiovascular;  Laterality: N/A;  . CARDIOVERSION N/A 07/24/2020   Procedure: CARDIOVERSION;  Surgeon: Elder Negus, MD;  Location: [facility];  Service: Cardiovascular;  Laterality: N/A;  . TEE WITHOUT CARDIOVERSION N/A 07/17/2020   Procedure: TRANSESOPHAGEAL ECHOCARDIOGRAM (TEE);  Surgeon: Elder Negus, MD;  Location: [facility] ENDOSCOPY;  Service: Cardiovascular;  Laterality: N/A;  . TONSILLECTOMY       Family History  Problem Relation Age of Onset  . Parkinson's disease Father      Social History   Socioeconomic History  . Marital status: Married    Spouse name: Not on file  . Number of children: 3  . Years of education: Not on file  . Highest education level: Not on file  Occupational History  . Not on file  Tobacco Use  . Smoking status: Former Smoker    Packs/day: 0.25    Years: 3.00    Pack years: 0.75    Types: Cigarettes    Quit date: 1974    Years since quitting: 47.6  . Smokeless tobacco: Former Clinical biochemist  . Vaping Use: Never used  Substance and Sexual Activity  . Alcohol use: Yes    Comment: occasional beer  . Drug use: Never  . Sexual activity: Not on file  Other Topics Concern  . Not on file  Social History Narrative  . Not on file   Social Determinants of Health   Financial Resource Strain:   . Difficulty of Paying Living Expenses: Not on file  Food Insecurity:   . Worried About Programme researcher, broadcasting/film/video in the Last Year: Not on file  . Ran Out of Food in the Last Year:  Not on file  Transportation Needs:   . Lack of Transportation (Medical): Not on file  . Lack of Transportation (Non-Medical): Not on file  Physical Activity:   . Days of Exercise per Week: Not on file  . Minutes of Exercise per Session: Not on file  Stress:   . Feeling of Stress : Not on file  Social Connections:   . Frequency of Communication with Friends and Family: Not on file  . Frequency of Social Gatherings with Friends and Family: Not on file  . Attends Religious Services: Not on file  . Active Member of Clubs or Organizations: Not on file  . Attends Banker Meetings: Not on file  . Marital Status: Not on file  Intimate Partner Violence:   . Fear of Current or Ex-Partner: Not on file  . Emotionally Abused: Not on file  . Physically Abused: Not on  file  . Sexually Abused: Not on file     BP 112/70   Pulse 60   Ht 5\' 9"  (1.753 m)   Wt 157 lb (71.2 kg)   SpO2 97%   BMI 23.18 kg/m   Physical Exam:  Well appearing 69 yo woman, NAD HEENT: Unremarkable Neck:  No JVD, no thyromegally Lymphatics:  No adenopathy Back:  No CVA tenderness Lungs:  Clear with no wheezes HEART:  Regular rate rhythm, no murmurs, no rubs, no clicks Abd:  soft, positive bowel sounds, no organomegally, no rebound, no guarding Ext:  2 plus pulses, no edema, no cyanosis, no clubbing Skin:  No rashes no nodules Neuro:  CN II through XII intact, motor grossly intact  EKG - reviewed. Atrial fib and flutter  Assess/Plan: 1. Atrial fib/flutter - I have discussed the similiarities and differences of both arrhythmias. She should probably be on systemic anti-coagulation indefinitely. I have recommended we start low dose flecainide and stop the cardizem. I would anticipate uptitration from 50 bid to either 75 or 100 bid going forward.   78 Annette Ohms,MD

## 2020-08-04 NOTE — Patient Instructions (Addendum)
Medication Instructions:  Your physician has recommended you make the following change in your medication:  1.  STOP diltiazem (cardizem)  2.  START taking flecainide 50 mg- one tablet by mouth twice a day.  Labwork: None ordered.  Testing/Procedures: None ordered.  Follow-Up:  Nurse visit- August 19, 2020 at 8:00 am at the [facility] office.  Follow up with DR. Taylor--October 05, 2020 at 9:30 am at the [facility] office  Any Other Special Instructions Will Be Listed Below (If Applicable).  If you need a refill on your cardiac medications before your next appointment, please call your pharmacy.    Flecainide tablets What is this medicine? FLECAINIDE (FLEK a nide) is an antiarrhythmic drug. This medicine is used to prevent irregular heart rhythm. It can also slow down fast heartbeats called tachycardia. This medicine may be used for other purposes; ask your health care provider or pharmacist if you have questions. COMMON BRAND NAME(S): Tambocor What should I tell my health care provider before I take this medicine? They need to know if you have any of these conditions:  abnormal levels of potassium in the blood  heart disease including heart rhythm and heart rate problems  kidney or liver disease  recent heart attack  an unusual or allergic reaction to flecainide, local anesthetics, other medicines, foods, dyes, or preservatives  pregnant or trying to get pregnant  breast-feeding How should I use this medicine? Take this medicine by mouth with a glass of water. Follow the directions on the prescription label. You can take this medicine with or without food. Take your doses at regular intervals. Do not take your medicine more often than directed. Do not stop taking this medicine suddenly. This may cause serious, heart-related side effects. If your doctor wants you to stop the medicine, the dose may be slowly lowered over time to avoid any side effects. Talk to your  pediatrician regarding the use of this medicine in children. While this drug may be prescribed for children as young as 1 year of age for selected conditions, precautions do apply. Overdosage: If you think you have taken too much of this medicine contact a poison control center or emergency room at once. NOTE: This medicine is only for you. Do not share this medicine with others. What if I miss a dose? If you miss a dose, take it as soon as you can. If it is almost time for your next dose, take only that dose. Do not take double or extra doses. What may interact with this medicine? Do not take this medicine with any of the following medications:  amoxapine  arsenic trioxide  certain antibiotics like clarithromycin, erythromycin, gatifloxacin, gemifloxacin, levofloxacin, moxifloxacin, sparfloxacin, or troleandomycin  certain antidepressants called tricyclic antidepressants like amitriptyline, imipramine, or nortriptyline  certain medicines to control heart rhythm like disopyramide, encainide, moricizine, procainamide, propafenone, and quinidine  cisapride  delavirdine  droperidol  haloperidol  hawthorn  imatinib  levomethadyl  maprotiline  medicines for malaria like chloroquine and halofantrine  pentamidine  phenothiazines like chlorpromazine, mesoridazine, prochlorperazine, thioridazine  pimozide  quinine  ranolazine  ritonavir  sertindole This medicine may also interact with the following medications:  cimetidine  dofetilide  medicines for angina or high blood pressure  medicines to control heart rhythm like amiodarone and digoxin  ziprasidone This list may not describe all possible interactions. Give your health care provider a list of all the medicines, herbs, non-prescription drugs, or dietary supplements you use. Also tell them if you smoke,  drink alcohol, or use illegal drugs. Some items may interact with your medicine. What should I watch for while  using this medicine? Visit your doctor or health care professional for regular checks on your progress. Because your condition and the use of this medicine carries some risk, it is a good idea to carry an identification card, necklace or bracelet with details of your condition, medications and doctor or health care professional. Check your blood pressure and pulse rate regularly. Ask your health care professional what your blood pressure and pulse rate should be, and when you should contact him or her. Your doctor or health care professional also may schedule regular blood tests and electrocardiograms to check your progress. You may get drowsy or dizzy. Do not drive, use machinery, or do anything that needs mental alertness until you know how this medicine affects you. Do not stand or sit up quickly, especially if you are an older patient. This reduces the risk of dizzy or fainting spells. Alcohol can make you more dizzy, increase flushing and rapid heartbeats. Avoid alcoholic drinks. What side effects may I notice from receiving this medicine? Side effects that you should report to your doctor or health care professional as soon as possible:  chest pain, continued irregular heartbeats  difficulty breathing  swelling of the legs or feet  trembling, shaking  unusually weak or tired Side effects that usually do not require medical attention (report to your doctor or health care professional if they continue or are bothersome):  blurred vision  constipation  headache  nausea, vomiting  stomach pain This list may not describe all possible side effects. Call your doctor for medical advice about side effects. You may report side effects to FDA at 1-800-FDA-1088. Where should I keep my medicine? Keep out of the reach of children. Store at room temperature between 15 and 30 degrees C (59 and 86 degrees F). Protect from light. Keep container tightly closed. Throw away any unused medicine after  the expiration date. NOTE: This sheet is a summary. It may not cover all possible information. If you have questions about this medicine, talk to your doctor, pharmacist, or health care provider.  2020 Elsevier/Gold Standard (2018-11-12 11:41:38)

## 2020-08-19 ENCOUNTER — Other Ambulatory Visit: Payer: Self-pay

## 2020-08-19 ENCOUNTER — Ambulatory Visit (INDEPENDENT_AMBULATORY_CARE_PROVIDER_SITE_OTHER): Payer: Medicare Other

## 2020-08-19 VITALS — HR 52

## 2020-08-19 DIAGNOSIS — I483 Typical atrial flutter: Secondary | ICD-10-CM

## 2020-08-19 DIAGNOSIS — I4891 Unspecified atrial fibrillation: Secondary | ICD-10-CM

## 2020-08-19 NOTE — Progress Notes (Signed)
1.) Reason for visit: new start flecainide  2.) Name of MD requesting visit: Dr. Ladona Ridgel  3.) H&P: Pt with afib and aflutter.  Started on flecainide.  4.) ROS related to problem: Pt states she is doing well on the new medication.  No side effects.  5.) Assessment and plan per MD: Per Dr. Ladona Ridgel- Continue on current dose of flecainide and follow up as scheduled.

## 2020-08-19 NOTE — Patient Instructions (Signed)
Follow up as previously scheduled

## 2020-09-04 ENCOUNTER — Other Ambulatory Visit: Payer: Self-pay | Admitting: Cardiology

## 2020-09-04 DIAGNOSIS — I483 Typical atrial flutter: Secondary | ICD-10-CM

## 2020-09-11 ENCOUNTER — Other Ambulatory Visit: Payer: Self-pay

## 2020-09-11 ENCOUNTER — Telehealth: Payer: Self-pay

## 2020-09-11 ENCOUNTER — Telehealth: Payer: Self-pay | Admitting: *Deleted

## 2020-09-11 MED ORDER — METOPROLOL SUCCINATE ER 25 MG PO TB24
50.0000 mg | ORAL_TABLET | Freq: Every day | ORAL | 2 refills | Status: DC
Start: 2020-09-11 — End: 2020-11-02

## 2020-09-11 NOTE — Telephone Encounter (Signed)
Patient was referred by Dr. Rosemary Holms, [facility] to Dr. Ladona Ridgel for evaluation. Patient called our office requesting a medication refill for Metoprolol 25 mg. I called to [facility] office and spoke with [place], who stated that she would refill medication. I called the patient and advised her that it will take 24-48 hours for Dr. Damian Leavell office to send medication into [facility], [address].

## 2020-10-05 ENCOUNTER — Ambulatory Visit (INDEPENDENT_AMBULATORY_CARE_PROVIDER_SITE_OTHER): Payer: Medicare Other | Admitting: Internal Medicine

## 2020-10-05 ENCOUNTER — Other Ambulatory Visit: Payer: Self-pay

## 2020-10-05 ENCOUNTER — Encounter: Payer: Self-pay | Admitting: Internal Medicine

## 2020-10-05 VITALS — BP 124/60 | HR 64 | Ht 69.0 in | Wt 160.2 lb

## 2020-10-05 DIAGNOSIS — I483 Typical atrial flutter: Secondary | ICD-10-CM

## 2020-10-05 DIAGNOSIS — I480 Paroxysmal atrial fibrillation: Secondary | ICD-10-CM | POA: Insufficient documentation

## 2020-10-05 NOTE — Patient Instructions (Signed)

## 2020-10-05 NOTE — Progress Notes (Signed)
HPI Annette Hampton returns today for followup. She is a pleasant 69 yo woman with a h/o palpitations and atrial fib and flutter and sinus node dysfunction. Since I saw her and placed her on low dose flecainide she has not had any recurrent palpitations. She denies a h/o syncope.  No Known Allergies   Current Outpatient Medications  Medication Sig Dispense Refill  . alendronate (FOSAMAX) 70 MG tablet Take 70 mg by mouth every Sunday.     Marland Kitchen CALCIUM CITRATE PO Take 2 tablets by mouth daily.     Marland Kitchen ELIQUIS 5 MG TABS tablet TAKE 1 TABLET(5 MG) BY MOUTH TWICE DAILY 60 tablet 2  . flecainide (TAMBOCOR) 50 MG tablet Take 1 tablet (50 mg total) by mouth 2 (two) times daily. 180 tablet 3  . ibuprofen (ADVIL) 200 MG tablet Take 400 mg by mouth every 6 (six) hours as needed for headache.    . metoprolol succinate (TOPROL-XL) 25 MG 24 hr tablet Take 2 tablets (50 mg total) by mouth daily. 30 tablet 2  . Multiple Vitamin (MULTI-VITAMIN DAILY PO) Take 2 tablets by mouth daily.      No current facility-administered medications for this visit.     Past Medical History:  Diagnosis Date  . Tachycardia     ROS:   All systems reviewed and negative except as noted in the HPI.   Past Surgical History:  Procedure Laterality Date  . CARDIOVERSION N/A 07/17/2020   Procedure: CARDIOVERSION;  Surgeon: Elder Negus, MD;  Location: [facility];  Service: Cardiovascular;  Laterality: N/A;  . CARDIOVERSION N/A 07/24/2020   Procedure: CARDIOVERSION;  Surgeon: Elder Negus, MD;  Location: [facility];  Service: Cardiovascular;  Laterality: N/A;  . TEE WITHOUT CARDIOVERSION N/A 07/17/2020   Procedure: TRANSESOPHAGEAL ECHOCARDIOGRAM (TEE);  Surgeon: Elder Negus, MD;  Location: [facility] ENDOSCOPY;  Service: Cardiovascular;  Laterality: N/A;  . TONSILLECTOMY       Family History  Problem Relation Age of Onset  . Parkinson's disease Father      Social History   Socioeconomic  History  . Marital status: Married    Spouse name: Not on file  . Number of children: 3  . Years of education: Not on file  . Highest education level: Not on file  Occupational History  . Not on file  Tobacco Use  . Smoking status: Former Smoker    Packs/day: 0.25    Years: 3.00    Pack years: 0.75    Types: Cigarettes    Quit date: 1974    Years since quitting: 47.8  . Smokeless tobacco: Former Clinical biochemist  . Vaping Use: Never used  Substance and Sexual Activity  . Alcohol use: Yes    Comment: occasional beer  . Drug use: Never  . Sexual activity: Not on file  Other Topics Concern  . Not on file  Social History Narrative  . Not on file   Social Determinants of Health   Financial Resource Strain:   . Difficulty of Paying Living Expenses: Not on file  Food Insecurity:   . Worried About Programme researcher, broadcasting/film/video in the Last Year: Not on file  . Ran Out of Food in the Last Year: Not on file  Transportation Needs:   . Lack of Transportation (Medical): Not on file  . Lack of Transportation (Non-Medical): Not on file  Physical Activity:   . Days of Exercise per Week: Not on file  .  Minutes of Exercise per Session: Not on file  Stress:   . Feeling of Stress : Not on file  Social Connections:   . Frequency of Communication with Friends and Family: Not on file  . Frequency of Social Gatherings with Friends and Family: Not on file  . Attends Religious Services: Not on file  . Active Member of Clubs or Organizations: Not on file  . Attends Banker Meetings: Not on file  . Marital Status: Not on file  Intimate Partner Violence:   . Fear of Current or Ex-Partner: Not on file  . Emotionally Abused: Not on file  . Physically Abused: Not on file  . Sexually Abused: Not on file     BP 124/60   Pulse 64   Ht 5\' 9"  (1.753 m)   Wt 160 lb 3.2 oz (72.7 kg)   SpO2 100%   BMI 23.66 kg/m   Physical Exam:  Well appearing NAD HEENT: Unremarkable Neck:  No JVD,  no thyromegally Lymphatics:  No adenopathy Back:  No CVA tenderness Lungs:  Clear with no wheezes HEART:  Regular rate rhythm, no murmurs, no rubs, no clicks Abd:  soft, positive bowel sounds, no organomegally, no rebound, no guarding Ext:  2 plus pulses, no edema, no cyanosis, no clubbing Skin:  No rashes no nodules Neuro:  CN II through XII intact, motor grossly intact  EKG - sinus bradycardia   Assess/Plan: 1. PAF - she appears to be maintaining NSR. She will continue low dose flecainide. 2. Sinus node dysfunction - she is a little slow on low dose flecainide and a beta blocker but is asymptomatic.  Isadore Bokhari,MD

## 2020-11-02 ENCOUNTER — Other Ambulatory Visit: Payer: Self-pay

## 2020-11-02 ENCOUNTER — Other Ambulatory Visit: Payer: Self-pay | Admitting: Cardiology

## 2020-11-02 DIAGNOSIS — I483 Typical atrial flutter: Secondary | ICD-10-CM

## 2020-11-02 MED ORDER — METOPROLOL SUCCINATE ER 25 MG PO TB24
50.0000 mg | ORAL_TABLET | Freq: Every day | ORAL | 1 refills | Status: DC
Start: 2020-11-02 — End: 2021-01-04

## 2020-11-02 MED ORDER — METOPROLOL SUCCINATE ER 25 MG PO TB24
50.0000 mg | ORAL_TABLET | Freq: Every day | ORAL | 2 refills | Status: DC
Start: 2020-11-02 — End: 2020-11-02

## 2020-11-04 ENCOUNTER — Other Ambulatory Visit: Payer: Self-pay | Admitting: Internal Medicine

## 2020-11-12 ENCOUNTER — Encounter: Payer: Self-pay | Admitting: Cardiology

## 2020-11-12 ENCOUNTER — Ambulatory Visit: Payer: Medicare Other | Admitting: Cardiology

## 2020-11-12 ENCOUNTER — Other Ambulatory Visit: Payer: Self-pay

## 2020-11-12 VITALS — BP 115/62 | HR 54 | Resp 16 | Ht 69.0 in | Wt 165.0 lb

## 2020-11-12 DIAGNOSIS — I483 Typical atrial flutter: Secondary | ICD-10-CM

## 2020-11-12 DIAGNOSIS — I480 Paroxysmal atrial fibrillation: Secondary | ICD-10-CM

## 2020-11-12 NOTE — Progress Notes (Signed)
Patient referred by Wenda Low, MD for tachycardia  Subjective:   Annette Hampton, female    DOB: 1951-07-19, 69 y.o.   MRN: [id number]   Chief Complaint  Patient presents with  . Atrial Flutter    Paroxysmal  . Follow-up    3 months     HPI  69 y/o Caucasian female with osteoporosis, paroxysmal atrial flutter  Patient was evaluated by Dr. Lovena Le who recommended flecainide. She is tolerating flecainide well. She has not had any recurrence of palpitations episodes.     Current Outpatient Medications on File Prior to Visit  Medication Sig Dispense Refill  . ELIQUIS 5 MG TABS tablet TAKE 1 TABLET(5 MG) BY MOUTH TWICE DAILY 60 tablet 2  . alendronate (FOSAMAX) 70 MG tablet Take 70 mg by mouth every Sunday.     Marland Kitchen CALCIUM CITRATE PO Take 2 tablets by mouth daily.     . flecainide (TAMBOCOR) 50 MG tablet TAKE 1 TABLET(50 MG) BY MOUTH TWICE DAILY 180 tablet 3  . ibuprofen (ADVIL) 200 MG tablet Take 400 mg by mouth every 6 (six) hours as needed for headache.    . metoprolol succinate (TOPROL-XL) 25 MG 24 hr tablet Take 2 tablets (50 mg total) by mouth daily. 90 tablet 1  . Multiple Vitamin (MULTI-VITAMIN DAILY PO) Take 2 tablets by mouth daily.      No current facility-administered medications on file prior to visit.    Cardiovascular and other pertinent studies:  EKG 11/12/2020: Sinus rhythm 52 bpm  Early repolarization changes, normal variant  Echocardiogram 07/30/2020: Left ventricle cavity is normal in size and wall thickness. Normal global wall motion. Normal LV systolic function with EF 55%. Normal diastolic filling pattern.  Mild biatrial dilatation. Mild (Grade I) mitral regurgitation. Mild tricuspid regurgitation. Estimated pulmonary artery systolic pressure 32 mmHg.  Cardioversion 07/24/2020: Atrial flutter-->Afib  TEE/cardioversion 07/17/2020 Atrial flutter-->sinus rhythm  EKG 07/16/2020: Typical atrial flutter 2: 1 conduction RVR 145 bpm Nonspecific  ST-T changes  Recent labs: 07/13/2020: Glucose 89, BUN/Cr 17/1.13. EGFR 48/58. Na/K 141/4.4. T.bili 1.5. Rest of the CMP normal H/H 16/46. MCV 101. Platelets 194 Chol 191, TG 73, HDL 78, LDL 100 TSH 2.8 normal    Review of Systems  Cardiovascular: Negative for chest pain, dyspnea on exertion, leg swelling, palpitations and syncope.         Vitals:   11/12/20 1441  BP: 115/62  Pulse: (!) 54  Resp: 16  SpO2: 99%     Body mass index is 24.37 kg/m. Filed Weights   11/12/20 1441  Weight: 165 lb (74.8 kg)     Objective:   Physical Exam Vitals and nursing note reviewed.  Constitutional:      General: She is not in acute distress. Neck:     Vascular: No JVD.  Cardiovascular:     Rate and Rhythm: Normal rate and regular rhythm.     Pulses: Normal pulses.     Heart sounds: Normal heart sounds. No murmur heard.   Pulmonary:     Effort: Pulmonary effort is normal.     Breath sounds: Normal breath sounds. No wheezing or rales.          Assessment & Recommendations:    69 y/o Caucasian female with osteoporosis, paroxysmal atrial flutter/fibrillation  Paroxysmal Afib/flutter: Typical atrial flutter with successful cardioversion 07/17/2020, conversion to A. fib after cardioversion on 07/24/2020 Maintaining sinus rhythm on flecainide 50 mg bid, along with metoprolol succinate 25 mg daily. Will check treadmill  stress test  CHA2DS2-VASc score 2, annual stroke risk 2%. Recommend eliquis 5 mg bid for now.  F/u in 6 months  Beni Turrell Esther Hardy, MD [facility] Pager: [phone] Office: [phone]

## 2020-12-02 ENCOUNTER — Ambulatory Visit: Payer: Medicare Other

## 2020-12-02 ENCOUNTER — Other Ambulatory Visit: Payer: Self-pay

## 2020-12-02 DIAGNOSIS — I480 Paroxysmal atrial fibrillation: Secondary | ICD-10-CM

## 2020-12-02 DIAGNOSIS — I483 Typical atrial flutter: Secondary | ICD-10-CM

## 2021-01-03 ENCOUNTER — Other Ambulatory Visit: Payer: Self-pay | Admitting: Cardiology

## 2021-02-04 ENCOUNTER — Other Ambulatory Visit: Payer: Self-pay | Admitting: Cardiology

## 2021-02-04 DIAGNOSIS — I483 Typical atrial flutter: Secondary | ICD-10-CM

## 2021-04-05 ENCOUNTER — Other Ambulatory Visit: Payer: Self-pay | Admitting: Cardiology

## 2021-05-06 ENCOUNTER — Other Ambulatory Visit: Payer: Self-pay | Admitting: Cardiology

## 2021-05-06 DIAGNOSIS — I483 Typical atrial flutter: Secondary | ICD-10-CM

## 2021-05-14 ENCOUNTER — Ambulatory Visit: Payer: Medicare Other | Admitting: Cardiology

## 2021-06-11 ENCOUNTER — Ambulatory Visit: Payer: Medicare Other | Admitting: Cardiology

## 2021-06-11 ENCOUNTER — Other Ambulatory Visit: Payer: Self-pay

## 2021-06-11 ENCOUNTER — Inpatient Hospital Stay: Payer: Medicare Other

## 2021-06-11 ENCOUNTER — Encounter: Payer: Self-pay | Admitting: Cardiology

## 2021-06-11 VITALS — BP 118/60 | HR 53 | Temp 97.7°F | Ht 69.0 in | Wt 164.0 lb

## 2021-06-11 DIAGNOSIS — I480 Paroxysmal atrial fibrillation: Secondary | ICD-10-CM

## 2021-06-11 DIAGNOSIS — R42 Dizziness and giddiness: Secondary | ICD-10-CM

## 2021-06-11 DIAGNOSIS — R55 Syncope and collapse: Secondary | ICD-10-CM

## 2021-06-11 NOTE — Progress Notes (Signed)
Patient referred by Wenda Low, MD for tachycardia  Subjective:   Annette Hampton, female    DOB: 08/19/1951, 70 y.o.   MRN: [id number]   Chief Complaint  Patient presents with   Atrial Fibrillation     HPI  70 y/o Caucasian female with osteoporosis, paroxysmal atrial flutter  Patient was evaluated by Dr. Lovena Le who recommended flecainide.  Recently, she has noted episodes of irregular heart rhythm with heart rate around 80 bpm.  This is higher than her baseline heart rate of 50s and 60s.  These episodes last for several hours, and are associated with lightheadedness as well as low blood pressure with systolic blood pressure in 90s.  She has felt lightheaded, but has not had a frank syncope.  Separately, she also notices episodes of eye floaters.  These episodes are separated in time from her tachycardia episodes.  She denies any chest pain, shortness of breath, orthopnea, PND, leg edema.   Current Outpatient Medications on File Prior to Visit  Medication Sig Dispense Refill   alendronate (FOSAMAX) 70 MG tablet Take 70 mg by mouth every Sunday.      CALCIUM CITRATE PO Take 2 tablets by mouth daily.      ELIQUIS 5 MG TABS tablet TAKE 1 TABLET(5 MG) BY MOUTH TWICE DAILY 60 tablet 2   flecainide (TAMBOCOR) 50 MG tablet TAKE 1 TABLET(50 MG) BY MOUTH TWICE DAILY 180 tablet 3   ibuprofen (ADVIL) 200 MG tablet Take 400 mg by mouth every 6 (six) hours as needed for headache.     metoprolol succinate (TOPROL-XL) 25 MG 24 hr tablet TAKE 2 TABLETS(50 MG) BY MOUTH DAILY 90 tablet 1   Multiple Vitamin (MULTI-VITAMIN DAILY PO) Take 2 tablets by mouth daily.      No current facility-administered medications on file prior to visit.    Cardiovascular and other pertinent studies:  EKG 06/11/2021: Sinus rhythm 53 bpm Anteroseptal infarct -age undetermined  Exercise treadmill stress test 12/02/2020: Exercise time: 12 Minutes and 18 seconds on Bruce protocol.  Achieved 83% age-predicted  maximum heart rate and 13.86 METs. Functional capacity: Excellent Chest pain: None Heart rate response: Submaximal but appropriate Blood pressure response: Physiological Submaximal GXT, at the current workload it is a low risk study.  Echocardiogram 07/30/2020: Left ventricle cavity is normal in size and wall thickness. Normal global wall motion. Normal LV systolic function with EF 55%. Normal diastolic filling pattern.  Mild biatrial dilatation. Mild (Grade I) mitral regurgitation. Mild tricuspid regurgitation. Estimated pulmonary artery systolic pressure 32 mmHg.  Cardioversion 07/24/2020: Atrial flutter-->Afib  TEE/cardioversion 07/17/2020 Atrial flutter-->sinus rhythm  EKG 07/16/2020: Typical atrial flutter 2: 1 conduction RVR 145 bpm Nonspecific ST-T changes  Recent labs: 07/13/2020: Glucose 89, BUN/Cr 17/1.13. EGFR 48/58. Na/K 141/4.4. T.bili 1.5. Rest of the CMP normal H/H 16/46. MCV 101. Platelets 194 Chol 191, TG 73, HDL 78, LDL 100 TSH 2.8 normal    Review of Systems  Cardiovascular:  Positive for irregular heartbeat. Negative for chest pain, dyspnea on exertion, leg swelling, palpitations and syncope.  Neurological:  Positive for light-headedness.        Vitals:   06/11/21 0927  BP: 118/60  Pulse: (!) 53  Temp: 97.7 F (36.5 C)  SpO2: 100%     Body mass index is 24.22 kg/m. Filed Weights   06/11/21 0927  Weight: 164 lb (74.4 kg)     Objective:   Physical Exam Vitals and nursing note reviewed.  Constitutional:  General: She is not in acute distress. Neck:     Vascular: No JVD.  Cardiovascular:     Rate and Rhythm: Normal rate and regular rhythm.     Heart sounds: Normal heart sounds. No murmur heard. Pulmonary:     Effort: Pulmonary effort is normal.     Breath sounds: Normal breath sounds. No wheezing or rales.         Assessment & Recommendations:    70 y/o Caucasian female with osteoporosis, paroxysmal atrial  flutter/fibrillation  Paroxysmal Afib/flutter: Typical atrial flutter with successful cardioversion 07/17/2020, conversion to A. fib after cardioversion on 07/24/2020 In sinus rhythm today, on flecainide 50 mg bid, along with metoprolol succinate 25 mg daily. However, I suspect that she is having breakthrough episodes of paroxysmal A. fib.  Episodes are few weeks apart.  I will start with 2-week cardiac telemetry monitoring.  If she does indeed have recurrent A. fib, could consider increasing flecainide dose to 100 mg twice daily, or alternate rhythm control methods. I think her ocular complaints are unrelated to her possible A. fib episodes.  I encouraged her to see ophthalmologist or optometrist. CHA2DS2-VASc score 2, annual stroke risk 2%. Continue eliquis 5 mg bid   Cc: Dr. Lovena Le  F/u in 6 weeks  Shukri Nistler Esther Hardy, MD [facility] [place] Pager: [phone] Office: [phone]

## 2021-06-12 ENCOUNTER — Telehealth: Payer: Self-pay | Admitting: Cardiology

## 2021-06-12 DIAGNOSIS — I480 Paroxysmal atrial fibrillation: Secondary | ICD-10-CM

## 2021-06-12 NOTE — Telephone Encounter (Signed)
ON-CALL CARDIOLOGY 06/12/21  Patient's name: Annette Hampton.   MRN: [id number].    DOB: 12/05/1951 Primary care provider: Georgann Housekeeper, MD. Primary cardiologist: Dr. Truett Mainland  Interaction regarding this patient's care today: Received a call from [facility] stating that she had episodes of atrial fibrillation with a controlled ventricular rate of around 99 bpm.  Review of electronic medical record notes that she already has a diagnosis of PAF and is currently on Toprol-XL, flecainide, and Eliquis.  Did not inform the patient of this finding as she is already known diagnosis of A. fib and is on appropriate medical therapy.  But will forward the message on to primary cardiology for reference.  Impression:   ICD-10-CM   1. Paroxysmal atrial fibrillation (HCC)  I48.0       Recommendations: Currently on medical therapy for paroxysmal atrial fibrillation including Toprol-XL, flecainide, Eliquis.  No new orders originated as a part of this telephone encounter.  Patient has not been informed of his as this is a known diagnosis for her.  But updating her primary cardiologist.  No charge.  Tessa Lerner, [place], [facility]  Pager: [phone] Office: [phone]

## 2021-06-12 NOTE — Telephone Encounter (Signed)
Noted! Thank you

## 2021-06-16 ENCOUNTER — Inpatient Hospital Stay: Payer: Medicare Other

## 2021-06-20 ENCOUNTER — Telehealth: Payer: Self-pay | Admitting: Cardiology

## 2021-06-20 DIAGNOSIS — I480 Paroxysmal atrial fibrillation: Secondary | ICD-10-CM

## 2021-06-20 NOTE — Telephone Encounter (Signed)
ON-CALL CARDIOLOGY 06/20/21  Patient's name: Annette Hampton.   MRN: [id number].    DOB: [birth year] Primary care provider: Georgann Housekeeper, MD. Primary cardiologist: Dr. Truett Mainland  Interaction regarding this patient's care today: Called back [facility] [phone] Auto triggered event of 6.25 sec pause conversion from Afib to NSR.  Today at 120 pm. Reached out to the patient who notes a brief episode of lightheadedness but no near syncope or syncope.   Impression:   ICD-10-CM   1. Paroxysmal atrial fibrillation (HCC)  I48.0       Recommendations: No changes made to her medical management.  Will forward to primary cardiologist for reference.   No charge.   Telephone encounter total time: 5 minutes  Delilah Shan [facility]  Pager: [phone] Office: [phone]

## 2021-06-29 ENCOUNTER — Telehealth: Payer: Self-pay | Admitting: Internal Medicine

## 2021-06-29 DIAGNOSIS — I480 Paroxysmal atrial fibrillation: Secondary | ICD-10-CM

## 2021-06-29 NOTE — Telephone Encounter (Signed)
Pt with h/o AF and states episodes were "few and far between" but she has noticed they are starting to increase in frequency.  Pt states she had an episode of AF that lasted from Friday to Sunday.  HRs as high as 120bpm. On Sunday she spoke with Dr. Graciela Husbands and he advised for her to double her Flecainide and Metoprolol.  This helped and she now feels like she is back in rhythm. Typically knows when she's in AF.  States Dr. Graciela Husbands also mentioned moving forward with AF ablation.  Denies any missed doses of Eliquis.  Does have lightheadedness/dizziness with these episodes and position changes which resolves with rest.  Recently wore a monitor but the results are not back yet.  This was ordered by Dr. Rosemary Holms.  HR today 50s-60s.  Advised I will update Dr. Ladona Ridgel and his nurse and our office will be in contact with next steps.  Pt appreciative for call.

## 2021-06-29 NOTE — Telephone Encounter (Signed)
Dr. Ladona Ridgel said to refer pt to Dr. Johney Frame to discuss ablation.  Spoke with pt and made her aware that I will place referral and scheduler will contact her to get an appt.  Pt appreciative for call.

## 2021-06-29 NOTE — Telephone Encounter (Signed)
Patient c/o Palpitations:  High priority if patient c/o lightheadedness, shortness of breath, or chest pain  How long have you had palpitations/irregular HR/ Afib? Are you having the symptoms now? Yes  Are you currently experiencing lightheadedness, SOB or CP? Lightheadedness  Do you have a history of afib (atrial fibrillation) or irregular heart rhythm? yes  Have you checked your BP or HR? (document readings if available): HR 120  Are you experiencing any other symptoms? Flutter in her chest

## 2021-07-04 ENCOUNTER — Other Ambulatory Visit: Payer: Self-pay | Admitting: Cardiology

## 2021-07-04 NOTE — Progress Notes (Signed)
Hey Dr. Johney Frame,   Please see attached [facility] Patch PDF. Patient has had 13% symptomatic Afib, as well as symptomatic post conversion pauses upto 6.5 secs-at least one associated with syncope. Seeing you on 07/12/2021.  Thanks MJP

## 2021-07-05 NOTE — Progress Notes (Signed)
Spoke with the patient.  Although her event on 06/20/2021 was listed as "fainted" on [facility] patch, patient did not actually lose consciousness. She was presyncopal.  Elder Negus, MD

## 2021-07-12 ENCOUNTER — Encounter: Payer: Self-pay | Admitting: *Deleted

## 2021-07-12 ENCOUNTER — Encounter: Payer: Self-pay | Admitting: Internal Medicine

## 2021-07-12 ENCOUNTER — Ambulatory Visit (INDEPENDENT_AMBULATORY_CARE_PROVIDER_SITE_OTHER): Payer: Medicare Other | Admitting: Internal Medicine

## 2021-07-12 ENCOUNTER — Other Ambulatory Visit: Payer: Self-pay

## 2021-07-12 VITALS — BP 110/62 | HR 47 | Ht 69.0 in | Wt 165.0 lb

## 2021-07-12 DIAGNOSIS — I480 Paroxysmal atrial fibrillation: Secondary | ICD-10-CM

## 2021-07-12 DIAGNOSIS — I455 Other specified heart block: Secondary | ICD-10-CM

## 2021-07-12 LAB — CBC WITH DIFFERENTIAL/PLATELET
Basophils Absolute: 0.1 10*3/uL (ref 0.0–0.2)
Basos: 1 %
EOS (ABSOLUTE): 0.1 10*3/uL (ref 0.0–0.4)
Eos: 1 %
Hematocrit: 45.4 % (ref 34.0–46.6)
Hemoglobin: 15.1 g/dL (ref 11.1–15.9)
Immature Grans (Abs): 0 10*3/uL (ref 0.0–0.1)
Immature Granulocytes: 0 %
Lymphocytes Absolute: 2.6 10*3/uL (ref 0.7–3.1)
Lymphs: 33 %
MCH: 33.2 pg — ABNORMAL HIGH (ref 26.6–33.0)
MCHC: 33.3 g/dL (ref 31.5–35.7)
MCV: 100 fL — ABNORMAL HIGH (ref 79–97)
Monocytes Absolute: 0.6 10*3/uL (ref 0.1–0.9)
Monocytes: 8 %
Neutrophils Absolute: 4.4 10*3/uL (ref 1.4–7.0)
Neutrophils: 57 %
Platelets: 202 10*3/uL (ref 150–450)
RBC: 4.55 x10E6/uL (ref 3.77–5.28)
RDW: 11.7 % (ref 11.7–15.4)
WBC: 7.7 10*3/uL (ref 3.4–10.8)

## 2021-07-12 LAB — BASIC METABOLIC PANEL
BUN/Creatinine Ratio: 13 (ref 12–28)
BUN: 18 mg/dL (ref 8–27)
CO2: 26 mmol/L (ref 20–29)
Calcium: 9.9 mg/dL (ref 8.7–10.3)
Chloride: 100 mmol/L (ref 96–106)
Creatinine, Ser: 1.42 mg/dL — ABNORMAL HIGH (ref 0.57–1.00)
Glucose: 95 mg/dL (ref 65–99)
Potassium: 5.1 mmol/L (ref 3.5–5.2)
Sodium: 139 mmol/L (ref 134–144)
eGFR: 40 mL/min/{1.73_m2} — ABNORMAL LOW (ref >59)

## 2021-07-12 MED ORDER — FLECAINIDE ACETATE 100 MG PO TABS: 100.0000 mg | ORAL_TABLET | Freq: Two times a day (BID) | ORAL | 3 refills | Status: DC

## 2021-07-12 NOTE — Addendum Note (Signed)
Addended by: Sampson Goon on: 07/12/2021 02:25 PM   Modules accepted: Orders

## 2021-07-12 NOTE — Progress Notes (Signed)
PCP: Georgann Housekeeper, MD Primary Cardiologist: Dr Rosemary Holms Primary EP: Dr Dorian Pod is a 70 y.o. female who presents today for electrophysiology followup. She has known afib and atrial flutter.  She has been followed by Dr Ladona Ridgel.  She has been on flecainide for afib management.  She continues to have afib. She has been documented to have afib despite flecainide with recent event monitor (personally reviewed).  She also has been having post termination pauses with presyncope.   Since last being seen in our clinic, the patient reports doing very well.  Today, she denies symptoms of palpitations, chest pain, shortness of breath,  lower extremity edema,  or syncope.  The patient is otherwise without complaint today.   Past Medical History:  Diagnosis Date   Paroxysmal atrial fibrillation (HCC)    Sinus pause    post termination pauses of up to 6 seconds are noted   Typical atrial flutter [facility])    Past Surgical History:  Procedure Laterality Date   CARDIOVERSION N/A 07/17/2020   Procedure: CARDIOVERSION;  Surgeon: Elder Negus, MD;  Location: [facility] ENDOSCOPY;  Service: Cardiovascular;  Laterality: N/A;   CARDIOVERSION N/A 07/24/2020   Procedure: CARDIOVERSION;  Surgeon: Elder Negus, MD;  Location: [facility] ENDOSCOPY;  Service: Cardiovascular;  Laterality: N/A;   TEE WITHOUT CARDIOVERSION N/A 07/17/2020   Procedure: TRANSESOPHAGEAL ECHOCARDIOGRAM (TEE);  Surgeon: Elder Negus, MD;  Location: [facility] ENDOSCOPY;  Service: Cardiovascular;  Laterality: N/A;   TONSILLECTOMY      ROS- all systems are reviewed and negatives except as per HPI above  Current Outpatient Medications  Medication Sig Dispense Refill   alendronate (FOSAMAX) 70 MG tablet Take 70 mg by mouth every Sunday.      CALCIUM CITRATE PO Take 2 tablets by mouth daily.      ELIQUIS 5 MG TABS tablet TAKE 1 TABLET(5 MG) BY MOUTH TWICE DAILY 60 tablet 2   flecainide (TAMBOCOR) 50 MG tablet TAKE 1  TABLET(50 MG) BY MOUTH TWICE DAILY 180 tablet 3   ibuprofen (ADVIL) 200 MG tablet Take 400 mg by mouth every 6 (six) hours as needed for headache.     metoprolol succinate (TOPROL-XL) 25 MG 24 hr tablet TAKE 2 TABLETS(50 MG) BY MOUTH DAILY 90 tablet 1   Multiple Vitamin (MULTI-VITAMIN DAILY PO) Take 2 tablets by mouth daily.      No current facility-administered medications for this visit.    Physical Exam: Vitals:   07/12/21 1159  BP: 110/62  Pulse: (!) 47  SpO2: 97%  Weight: 165 lb (74.8 kg)  Height: 5\' 9"  (1.753 m)     GEN- The patient is well appearing, alert and oriented x 3 today.   Head- normocephalic, atraumatic Eyes-  Sclera clear, conjunctiva pink Ears- hearing intact Oropharynx- clear Lungs- Clear to ausculation bilaterally, normal work of breathing Heart- Regular rate and rhythm, no murmurs, rubs or gallops, PMI not laterally displaced GI- soft, NT, ND, + BS Extremities- no clubbing, cyanosis, or edema  Wt Readings from Last 3 Encounters:  07/12/21 165 lb (74.8 kg)  06/11/21 164 lb (74.4 kg)  11/12/20 165 lb (74.8 kg)    EKG tracing ordered today is personally reviewed and shows sinus bradycardia  Event monitor 7/22 is reviewed and reveals AF burden 13% with both af and afl observed.  Post termination pauses of up to 6 seconds are noted  Echo 07/30/20- EF 55%, mild MR, mild atrial enlargement  Assessment and Plan:  Paroxysmal atrial  fibrillation/ atrial flutter The patient has symptomatic, recurrent atrial fibrillation and atrial flutter. she has failed medical therapy with flecainide. Chads2vasc score is at least 3.  she is anticoagulated with eliquis . Therapeutic strategies for afib including medicine and ablation were discussed in detail with the patient today. Risk, benefits, and alternatives to EP study and radiofrequency ablation for afib were also discussed in detail today. These risks include but are not limited to stroke, bleeding, vascular damage,  tamponade, perforation, damage to the esophagus, lungs, and other structures, pulmonary vein stenosis, worsening renal function, and death. The patient understands these risk and wishes to proceed.  We will therefore proceed with catheter ablation at the next available time.  Carto, ICE, anesthesia are requested for the procedure.  Will also obtain cardiac CT prior to the procedure to exclude LAA thrombus and further evaluate atrial anatomy.  2. Post termination pauses We discussed at length today She would like to avoid PPM I have advised that she should NOT drive when in afib. Hopefully if we can control her AF, we can minimize pauses.   Risks, benefits and potential toxicities for medications prescribed and/or refilled reviewed with patient today.   Hillis Range MD, [facility] 07/12/2021 12:23 PM

## 2021-07-12 NOTE — Patient Instructions (Addendum)
Medication Instructions:  Your physician recommends that you continue on your current medications as directed. Please refer to the Current Medication list given to you today.  Labwork: CBC, BMP  Testing/Procedures: Your physician has requested that you have cardiac CT. Cardiac computed tomography (CT) is a painless test that uses an x-ray machine to take clear, detailed pictures of your heart. For further information please visit www.cardiosmart.org. Please follow instruction sheet as given.   Your physician has recommended that you have an ablation. Catheter ablation is a medical procedure used to treat some cardiac arrhythmias (irregular heartbeats). During catheter ablation, a long, thin, flexible tube is put into a blood vessel in your groin (upper thigh), or neck. This tube is called an ablation catheter. It is then guided to your heart through the blood vessel. Radio frequency waves destroy small areas of heart tissue where abnormal heartbeats may cause an arrhythmia to start. Please see the instruction sheet given to you today.   Any Other Special Instructions Will Be Listed Below (If Applicable).  If you need a refill on your cardiac medications before your next appointment, please call your pharmacy.   Cardiac Ablation Cardiac ablation is a procedure to destroy (ablate) some heart tissue that is sending bad signals. These bad signals causeproblems in heart rhythm. The heart has many areas that make these signals. If there are problems in these areas, they can make the heart beat in a way that is not normal.Destroying some tissues can help make the heart rhythm normal. Tell your doctor about: Any allergies you have. All medicines you are taking. These include vitamins, herbs, eye drops, creams, and over-the-counter medicines. Any problems you or family members have had with medicines that make you fall asleep (anesthetics). Any blood disorders you have. Any surgeries you have  had. Any medical conditions you have, such as kidney failure. Whether you are pregnant or may be pregnant. What are the risks? This is a safe procedure. But problems may occur, including: Infection. Bruising and bleeding. Bleeding into the chest. Stroke or blood clots. Damage to nearby areas of your body. Allergies to medicines or dyes. The need for a pacemaker if the normal system is damaged. Failure of the procedure to treat the problem. What happens before the procedure? Medicines Ask your doctor about: Changing or stopping your normal medicines. This is important. Taking aspirin and ibuprofen. Do not take these medicines unless your doctor tells you to take them. Taking other medicines, vitamins, herbs, and supplements. General instructions Follow instructions from your doctor about what you cannot eat or drink. Plan to have someone take you home from the hospital or clinic. If you will be going home right after the procedure, plan to have someone with you for 24 hours. Ask your doctor what steps will be taken to prevent infection. What happens during the procedure?  An IV tube will be put into one of your veins. You will be given a medicine to help you relax. The skin on your neck or groin will be numbed. A cut (incision) will be made in your neck or groin. A needle will be put through your cut and into a large vein. A tube (catheter) will be put into the needle. The tube will be moved to your heart. Dye may be put through the tube. This helps your doctor see your heart. Small devices (electrodes) on the tube will send out signals. A type of energy will be used to destroy some heart tissue. The tube will   be taken out. Pressure will be held on your cut. This helps stop bleeding. A bandage will be put over your cut. The exact procedure may vary among doctors and hospitals. What happens after the procedure? You will be watched until you leave the hospital or clinic. This  includes checking your heart rate, breathing rate, oxygen, and blood pressure. Your cut will be watched for bleeding. You will need to lie still for a few hours. Do not drive for 24 hours or as long as your doctor tells you. Summary Cardiac ablation is a procedure to destroy some heart tissue. This is done to treat heart rhythm problems. Tell your doctor about any medical conditions you may have. Tell him or her about all medicines you are taking to treat them. This is a safe procedure. But problems may occur. These include infection, bruising, bleeding, and damage to nearby areas of your body. Follow what your doctor tells you about food and drink. You may also be told to change or stop some of your medicines. After the procedure, do not drive for 24 hours or as long as your doctor tells you. This information is not intended to replace advice given to you by your health care provider. Make sure you discuss any questions you have with your healthcare provider. Document Revised: 10/24/2019 Document Reviewed: 10/24/2019 Elsevier Patient Education  2022 Elsevier Inc.       

## 2021-07-21 ENCOUNTER — Telehealth: Payer: Self-pay | Admitting: Internal Medicine

## 2021-07-21 NOTE — Telephone Encounter (Signed)
   Pt would like to ask Dr. Johney Frame recommendations if she still need to see Dr. Rosemary Holms on 07/30/21 if she already scheduled for an ablation

## 2021-07-21 NOTE — Telephone Encounter (Signed)
Advised the patient to check with Dr. Damian Leavell office if see needs to see them again before the ablation.  Verbalized agreement

## 2021-07-26 ENCOUNTER — Encounter: Payer: Self-pay | Admitting: *Deleted

## 2021-07-26 ENCOUNTER — Telehealth: Payer: Self-pay | Admitting: Internal Medicine

## 2021-07-26 NOTE — Telephone Encounter (Signed)
Will sent in letter for patient. Advised this letter may not help and she needs to have a backup plan. IF we need to reschedule her procedure to let us know asap. The letter will be sent over mychart.  Patient verbalized understanding.

## 2021-07-26 NOTE — Telephone Encounter (Signed)
New Message:     Patient is scheduled to have her Ablation on 08-17-21. Her husband just found out that he have jury duty also on 08-17-21. She would like to know if Dr Johney Frame would write him a letter to be excused from [place] [employer] please. He will need to taker her to the hospital for her procedure and pick her up. She says her husband is her primary Caregiver and if Dr Johney Frame would also state that in the letter please. Please send this letter to her husband's e-mail address. The address is [email or web address].

## 2021-07-28 ENCOUNTER — Telehealth: Payer: Self-pay | Admitting: Internal Medicine

## 2021-07-28 NOTE — Telephone Encounter (Signed)
Encounter not needed

## 2021-07-30 ENCOUNTER — Ambulatory Visit: Payer: Medicare Other | Admitting: Cardiology

## 2021-08-06 ENCOUNTER — Telehealth (HOSPITAL_COMMUNITY): Payer: Self-pay | Admitting: Emergency Medicine

## 2021-08-06 NOTE — Telephone Encounter (Signed)
Reaching out to patient to offer assistance regarding upcoming cardiac imaging study; pt verbalizes understanding of appt date/time, parking situation and where to check in, pre-test NPO status and medications ordered, and verified current allergies; name and call back number provided for further questions should they arise Rockwell Alexandria RN Navigator Cardiac Imaging [facility] [phone] office [phone] cell  Denies iv issues Some claustro Daily meds

## 2021-08-07 ENCOUNTER — Other Ambulatory Visit: Payer: Self-pay | Admitting: Internal Medicine

## 2021-08-10 ENCOUNTER — Other Ambulatory Visit: Payer: Self-pay

## 2021-08-10 ENCOUNTER — Ambulatory Visit (HOSPITAL_COMMUNITY)
Admission: RE | Admit: 2021-08-10 | Discharge: 2021-08-10 | Disposition: A | Discharge status: Home / Self Care | Payer: Medicare Other | Source: Ambulatory Visit | Attending: Internal Medicine | Admitting: Internal Medicine

## 2021-08-10 DIAGNOSIS — I480 Paroxysmal atrial fibrillation: Secondary | ICD-10-CM | POA: Insufficient documentation

## 2021-08-10 MED ORDER — IOHEXOL 350 MG/ML SOLN
100.0000 mL | Freq: Once | INTRAVENOUS | Status: AC | PRN
  Administered 2021-08-10: 100 mL via INTRAVENOUS

## 2021-08-10 MED ORDER — NITROGLYCERIN 0.4 MG SL SUBL
0.8000 mg | SUBLINGUAL_TABLET | Freq: Once | SUBLINGUAL | Status: DC
Start: 2021-08-10 — End: 2021-08-10

## 2021-08-16 ENCOUNTER — Telehealth: Payer: Self-pay | Admitting: Internal Medicine

## 2021-08-16 NOTE — Telephone Encounter (Signed)
Annette Hampton is calling requesting to speak with [name] in regards to her ablation tomorrow.

## 2021-08-16 NOTE — Telephone Encounter (Signed)
Answered all questions. Patient verbalized understanding.  

## 2021-08-17 ENCOUNTER — Ambulatory Visit (HOSPITAL_COMMUNITY): Payer: Medicare Other | Admitting: Anesthesiology

## 2021-08-17 ENCOUNTER — Ambulatory Visit (HOSPITAL_COMMUNITY)
Admission: RE | Disposition: A | Discharge status: Home / Self Care | Payer: Self-pay | Source: Home / Self Care | Attending: Internal Medicine

## 2021-08-17 ENCOUNTER — Other Ambulatory Visit: Payer: Self-pay

## 2021-08-17 ENCOUNTER — Ambulatory Visit (HOSPITAL_COMMUNITY)
Admission: RE | Admit: 2021-08-17 | Discharge: 2021-08-17 | Disposition: A | Discharge status: Home / Self Care | Payer: Medicare Other | Attending: Internal Medicine | Admitting: Internal Medicine

## 2021-08-17 ENCOUNTER — Encounter (HOSPITAL_COMMUNITY): Payer: Self-pay | Admitting: Internal Medicine

## 2021-08-17 DIAGNOSIS — I480 Paroxysmal atrial fibrillation: Secondary | ICD-10-CM | POA: Diagnosis present

## 2021-08-17 DIAGNOSIS — I483 Typical atrial flutter: Secondary | ICD-10-CM | POA: Diagnosis not present

## 2021-08-17 DIAGNOSIS — Z7901 Long term (current) use of anticoagulants: Secondary | ICD-10-CM | POA: Insufficient documentation

## 2021-08-17 DIAGNOSIS — Z79899 Other long term (current) drug therapy: Secondary | ICD-10-CM | POA: Diagnosis not present

## 2021-08-17 HISTORY — PX: ATRIAL FIBRILLATION ABLATION: EP1191

## 2021-08-17 LAB — POCT ACTIVATED CLOTTING TIME
Activated Clotting Time: 289 seconds
Activated Clotting Time: 318 seconds

## 2021-08-17 SURGERY — ATRIAL FIBRILLATION ABLATION
Anesthesia: General

## 2021-08-17 MED ORDER — HEPARIN SODIUM (PORCINE) 1000 UNIT/ML IJ SOLN
INTRAMUSCULAR | Status: DC | PRN
  Administered 2021-08-17: 1000 [IU] via INTRAVENOUS
  Administered 2021-08-17: 15000 [IU] via INTRAVENOUS

## 2021-08-17 MED ORDER — HYDROCODONE-ACETAMINOPHEN 5-325 MG PO TABS: 1.0000 | ORAL_TABLET | ORAL | Status: DC | PRN

## 2021-08-17 MED ORDER — LIDOCAINE 2% (20 MG/ML) 5 ML SYRINGE
INTRAMUSCULAR | Status: DC | PRN
  Administered 2021-08-17: 40 mg via INTRAVENOUS

## 2021-08-17 MED ORDER — ROCURONIUM BROMIDE 10 MG/ML (PF) SYRINGE
PREFILLED_SYRINGE | INTRAVENOUS | Status: DC | PRN
  Administered 2021-08-17: 50 mg via INTRAVENOUS
  Administered 2021-08-17: 20 mg via INTRAVENOUS

## 2021-08-17 MED ORDER — HEPARIN (PORCINE) IN NACL 1000-0.9 UT/500ML-% IV SOLN
INTRAVENOUS | Status: AC
  Filled 2021-08-17: qty 500

## 2021-08-17 MED ORDER — ACETAMINOPHEN 325 MG PO TABS
ORAL_TABLET | ORAL | Status: AC
  Administered 2021-08-17: 650 mg via ORAL
  Filled 2021-08-17: qty 2

## 2021-08-17 MED ORDER — PHENYLEPHRINE HCL-NACL 20-0.9 MG/250ML-% IV SOLN
INTRAVENOUS | Status: DC | PRN
  Administered 2021-08-17: 20 ug/min via INTRAVENOUS

## 2021-08-17 MED ORDER — ONDANSETRON HCL 4 MG/2ML IJ SOLN
INTRAMUSCULAR | Status: AC
  Filled 2021-08-17: qty 2

## 2021-08-17 MED ORDER — PANTOPRAZOLE SODIUM 40 MG PO TBEC: 40.0000 mg | DELAYED_RELEASE_TABLET | Freq: Every day | ORAL | 0 refills | Status: DC

## 2021-08-17 MED ORDER — HEPARIN (PORCINE) IN NACL 1000-0.9 UT/500ML-% IV SOLN
INTRAVENOUS | Status: DC | PRN
  Administered 2021-08-17 (×2): 500 mL

## 2021-08-17 MED ORDER — SODIUM CHLORIDE 0.9% FLUSH: 3.0000 mL | Freq: Two times a day (BID) | INTRAVENOUS | Status: DC

## 2021-08-17 MED ORDER — SODIUM CHLORIDE 0.9 % IV SOLN: INTRAVENOUS | Status: DC

## 2021-08-17 MED ORDER — ONDANSETRON HCL 4 MG/2ML IJ SOLN
INTRAMUSCULAR | Status: DC | PRN
  Administered 2021-08-17: 4 mg via INTRAVENOUS

## 2021-08-17 MED ORDER — FENTANYL CITRATE (PF) 100 MCG/2ML IJ SOLN
INTRAMUSCULAR | Status: DC | PRN
  Administered 2021-08-17: 50 ug via INTRAVENOUS

## 2021-08-17 MED ORDER — SUGAMMADEX SODIUM 200 MG/2ML IV SOLN
INTRAVENOUS | Status: DC | PRN
  Administered 2021-08-17: 150 mg via INTRAVENOUS

## 2021-08-17 MED ORDER — SODIUM CHLORIDE 0.9 % IV SOLN: 250.0000 mL | INTRAVENOUS | Status: DC | PRN

## 2021-08-17 MED ORDER — HEPARIN SODIUM (PORCINE) 1000 UNIT/ML IJ SOLN
INTRAMUSCULAR | Status: AC
  Filled 2021-08-17: qty 2

## 2021-08-17 MED ORDER — PROTAMINE SULFATE 10 MG/ML IV SOLN
INTRAVENOUS | Status: DC | PRN
  Administered 2021-08-17: 40 mg via INTRAVENOUS

## 2021-08-17 MED ORDER — ETOMIDATE 2 MG/ML IV SOLN
INTRAVENOUS | Status: DC | PRN
  Administered 2021-08-17: 12 mg via INTRAVENOUS

## 2021-08-17 MED ORDER — DEXAMETHASONE SODIUM PHOSPHATE 10 MG/ML IJ SOLN
INTRAMUSCULAR | Status: DC | PRN
  Administered 2021-08-17: 5 mg via INTRAVENOUS

## 2021-08-17 MED ORDER — SODIUM CHLORIDE 0.9% FLUSH: 3.0000 mL | INTRAVENOUS | Status: DC | PRN

## 2021-08-17 MED ORDER — EPHEDRINE SULFATE-NACL 50-0.9 MG/10ML-% IV SOSY
PREFILLED_SYRINGE | INTRAVENOUS | Status: DC | PRN
Start: 2021-08-17 — End: 2021-08-17
  Administered 2021-08-17: 10 mg via INTRAVENOUS

## 2021-08-17 MED ORDER — ACETAMINOPHEN 325 MG PO TABS: 650.0000 mg | ORAL_TABLET | ORAL | Status: DC | PRN

## 2021-08-17 MED ORDER — APIXABAN 5 MG PO TABS
5.0000 mg | ORAL_TABLET | Freq: Once | ORAL | Status: AC
  Administered 2021-08-17: 5 mg via ORAL
  Filled 2021-08-17: qty 1

## 2021-08-17 MED ORDER — HEPARIN SODIUM (PORCINE) 1000 UNIT/ML IJ SOLN
INTRAMUSCULAR | Status: DC | PRN
  Administered 2021-08-17: 3000 [IU] via INTRAVENOUS

## 2021-08-17 MED ORDER — ONDANSETRON HCL 4 MG/2ML IJ SOLN
4.0000 mg | Freq: Four times a day (QID) | INTRAMUSCULAR | Status: DC | PRN
  Administered 2021-08-17: 4 mg via INTRAVENOUS

## 2021-08-17 SURGICAL SUPPLY — 18 items
CATH 8FR REPROCESSED SOUNDSTAR (CATHETERS) ×2 IMPLANT
CATH 8FR SOUNDSTAR REPROCESSED (CATHETERS) IMPLANT
CATH OCTARAY 2.0 F 3-3-3-3-3 (CATHETERS) ×1 IMPLANT
CATH SMTCH THERMOCOOL SF DF (CATHETERS) ×1 IMPLANT
CATH WEBSTER BI DIR CS D-F CRV (CATHETERS) ×1 IMPLANT
CLOSURE PERCLOSE PROSTYLE (VASCULAR PRODUCTS) ×5 IMPLANT
COVER SWIFTLINK CONNECTOR (BAG) ×2 IMPLANT
MAT PREVALON FULL STRYKER (MISCELLANEOUS) ×1 IMPLANT
NDL BAYLIS TRANSSEPTAL 71CM (NEEDLE) IMPLANT
NEEDLE BAYLIS TRANSSEPTAL 71CM (NEEDLE) ×2 IMPLANT
PACK EP LATEX FREE (CUSTOM PROCEDURE TRAY) ×2
PACK EP LF (CUSTOM PROCEDURE TRAY) ×1 IMPLANT
PAD PRO RADIOLUCENT 2001M-C (PAD) ×2 IMPLANT
PATCH CARTO3 (PAD) ×1 IMPLANT
SHEATH BAYLIS TORFLEX (SHEATH) ×1 IMPLANT
SHEATH PINNACLE 7F 10CM (SHEATH) ×2 IMPLANT
SHEATH PINNACLE 9F 10CM (SHEATH) ×1 IMPLANT
TUBING SMART ABLATE COOLFLOW (TUBING) ×1 IMPLANT

## 2021-08-17 NOTE — Transfer of Care (Signed)
Immediate Anesthesia Transfer of Care Note  Patient: Annette Hampton  Procedure(s) Performed: ATRIAL FIBRILLATION ABLATION  Patient Location: PACU  Anesthesia Type:General  Level of Consciousness: awake and alert   Airway & Oxygen Therapy: Patient Spontanous Breathing and Patient connected to nasal cannula oxygen  Post-op Assessment: Report given to RN and Post -op Vital signs reviewed and stable  Post vital signs: Reviewed and stable  Last Vitals:  Vitals Value Taken Time  BP 126/55 08/17/21 1520  Temp 36.8 C 08/17/21 1520  Pulse 68 08/17/21 1520  Resp 17 08/17/21 1520  SpO2 99 % 08/17/21 1520  Vitals shown include unvalidated device data.  Last Pain:  Vitals:   08/17/21 1520  TempSrc: Temporal  PainSc: 0-No pain         Complications: No notable events documented.

## 2021-08-17 NOTE — Anesthesia Preprocedure Evaluation (Addendum)
Anesthesia Evaluation  Patient identified by MRN, date of birth, ID band Patient awake    Reviewed: Allergy & Precautions, NPO status , Patient's Chart, lab work & pertinent test results  Airway Mallampati: I  TM Distance: >3 FB Neck ROM: Full    Dental  (+) Teeth Intact, Dental Advisory Given   Pulmonary former smoker,    breath sounds clear to auscultation       Cardiovascular negative cardio ROS   Rhythm:Regular Rate:Bradycardia  Echo: 1. Left ventricular ejection fraction, by estimation, is 25 to 30%. The  left ventricle has severely decreased function. The left ventricle  demonstrates global hypokinesis.  2. Right ventricular systolic function is mildly reduced. The right  ventricular size is normal.  3. No left atrial/left atrial appendage thrombus was detected. Normal LAA  emptying velocity.  4. Moderate mitral regurgitation. Mild tricuspid regurgitation.    Neuro/Psych negative neurological ROS  negative psych ROS   GI/Hepatic negative GI ROS, Neg liver ROS,   Endo/Other  negative endocrine ROS  Renal/GU negative Renal ROS     Musculoskeletal negative musculoskeletal ROS (+)   Abdominal Normal abdominal exam  (+)   Peds  Hematology negative hematology ROS (+)   Anesthesia Other Findings   Reproductive/Obstetrics                           Anesthesia Physical Anesthesia Plan  ASA: 3  Anesthesia Plan: General   Post-op Pain Management:    Induction: Intravenous  PONV Risk Score and Plan: 4 or greater and Ondansetron, Dexamethasone and Treatment may vary due to age or medical condition  Airway Management Planned: Oral ETT  Additional Equipment: None  Intra-op Plan:   Post-operative Plan: Extubation in OR  Informed Consent: I have reviewed the patients History and Physical, chart, labs and discussed the procedure including the risks, benefits and alternatives for  the proposed anesthesia with the patient or authorized representative who has indicated his/her understanding and acceptance.     Dental advisory given  Plan Discussed with: CRNA  Anesthesia Plan Comments:        Anesthesia Quick Evaluation

## 2021-08-17 NOTE — Discharge Instructions (Addendum)
Post procedure care instructions No driving for 4 days. No lifting over 5 lbs for 1 week. No vigorous or sexual activity for 1 week. You may return to work/your usual activities on 08/25/21. Keep procedure site clean & dry. If you notice increased pain, swelling, bleeding or pus, call/return!  You may shower after 24 hours, but no soaking in baths/hot tubs/pools for 1 week.     You have an appointment set up with the Atrial Fibrillation Clinic.  Multiple studies have shown that being followed by a dedicated atrial fibrillation clinic in addition to the standard care you receive from your other physicians improves health. We believe that enrollment in the atrial fibrillation clinic will allow [place] to better care for you.   The phone number to the Atrial Fibrillation Clinic is [phone]. The clinic is staffed Monday through Friday from 8:30am to 5pm.  Parking Directions: The clinic is located in the Heart and Vascular Building connected to [facility]. 1)From [address] turn on to [address] and go to the 3rd entrance  (Heart and Vascular entrance) on the right. 2)Look to the right for Heart &Vascular Parking Garage. 3)A code for the entrance is required, for Oct is 3333.   4)Take the elevators to the 1st floor. Registration is in the room with the glass walls at the end of the hallway.  If you have any trouble parking or locating the clinic, please don't hesitate to call [phone]. Cardiac Ablation, Care After  This sheet gives you information about how to care for yourself after your procedure. Your health care provider may also give you more specific instructions. If you have problems or questions, contact your health care provider. What can I expect after the procedure? After the procedure, it is common to have: Bruising around your puncture site. Tenderness around your puncture site. Skipped heartbeats. Tiredness (fatigue).  Follow these instructions at home: Puncture  site care  Follow instructions from your health care provider about how to take care of your puncture site. Make sure you: If present, leave stitches (sutures), skin glue, or adhesive strips in place. These skin closures may need to stay in place for up to 2 weeks. If adhesive strip edges start to loosen and curl up, you may trim the loose edges. Do not remove adhesive strips completely unless your health care provider tells you to do that. If a large square bandage is present, this may be removed 24 hours after surgery.  Check your puncture site every day for signs of infection. Check for: Redness, swelling, or pain. Fluid or blood. If your puncture site starts to bleed, lie down on your back, apply firm pressure to the area, and contact your health care provider. Warmth. Pus or a bad smell. Driving Do not drive for at least 4 days after your procedure or however long your health care provider recommends. (Do not resume driving if you have previously been instructed not to drive for other health reasons.) Do not drive or use heavy machinery while taking prescription pain medicine. Activity Avoid activities that take a lot of effort for at least 7 days after your procedure. Do not lift anything that is heavier than 5 lb (4.5 kg) for one week.  No sexual activity for 1 week.  Return to your normal activities as told by your health care provider. Ask your health care provider what activities are safe for you. General instructions Take over-the-counter and prescription medicines only as told by your health care provider.  Do not use any products that contain nicotine or tobacco, such as cigarettes and e-cigarettes. If you need help quitting, ask your health care provider. You may shower after 24 hours, but Do not take baths, swim, or use a hot tub for 1 week.  Do not drink alcohol for 24 hours after your procedure. Keep all follow-up visits as told by your health care provider. This is  important. Contact a health care provider if: You have redness, mild swelling, or pain around your puncture site. You have fluid or blood coming from your puncture site that stops after applying firm pressure to the area. Your puncture site feels warm to the touch. You have pus or a bad smell coming from your puncture site. You have a fever. You have chest pain or discomfort that spreads to your neck, jaw, or arm. You are sweating a lot. You feel nauseous. You have a fast or irregular heartbeat. You have shortness of breath. You are dizzy or light-headed and feel the need to lie down. You have pain or numbness in the arm or leg closest to your puncture site. Get help right away if: Your puncture site suddenly swells. Your puncture site is bleeding and the bleeding does not stop after applying firm pressure to the area. These symptoms may represent a serious problem that is an emergency. Do not wait to see if the symptoms will go away. Get medical help right away. Call your local emergency services (911 in the [place] Do not drive yourself to the hospital. Summary After the procedure, it is normal to have bruising and tenderness at the puncture site in your groin, neck, or forearm. Check your puncture site every day for signs of infection. Get help right away if your puncture site is bleeding and the bleeding does not stop after applying firm pressure to the area. This is a medical emergency. This information is not intended to replace advice given to you by your health care provider. Make sure you discuss any questions you have with your health care provider.

## 2021-08-17 NOTE — Anesthesia Procedure Notes (Signed)
Procedure Name: Intubation Date/Time: 08/17/2021 1:10 PM Performed by: Reece Agar, CRNA Pre-anesthesia Checklist: Patient identified, Emergency Drugs available, Suction available and Patient being monitored Patient Re-evaluated:Patient Re-evaluated prior to induction Oxygen Delivery Method: Circle System Utilized Preoxygenation: Pre-oxygenation with 100% oxygen Induction Type: IV induction Ventilation: Mask ventilation without difficulty Laryngoscope Size: Mac and 3 Grade View: Grade I Tube type: Oral Tube size: 7.0 mm Number of attempts: 1 Airway Equipment and Method: Stylet Placement Confirmation: ETT inserted through vocal cords under direct vision, positive ETCO2 and breath sounds checked- equal and bilateral Secured at: 21 cm Tube secured with: Tape Dental Injury: Teeth and Oropharynx as per pre-operative assessment

## 2021-08-17 NOTE — H&P (Signed)
PCP: Georgann Housekeeper, MD Primary Cardiologist: Dr Rosemary Holms Primary EP: Dr Ladona Ridgel   Annette Hampton is a 70 y.o. female who presents today for electrophysiology study and ablation for afib and atrial flutter. She has known afib and atrial flutter.  She has been followed by Dr Ladona Ridgel.  She has been on flecainide for afib management.  She continues to have afib. She has been documented to have afib despite flecainide with recent event monitor (personally reviewed).  She also has been having post termination pauses with presyncope.   Since last being seen in our clinic, the patient reports doing very well.  Today, she denies symptoms of palpitations, chest pain, shortness of breath,  lower extremity edema,  or syncope.  The patient is otherwise without complaint today.        Past Medical History:  Diagnosis Date   Paroxysmal atrial fibrillation (HCC)     Sinus pause      post termination pauses of up to 6 seconds are noted   Typical atrial flutter [facility])           Past Surgical History:  Procedure Laterality Date   CARDIOVERSION N/A 07/17/2020    Procedure: CARDIOVERSION;  Surgeon: Elder Negus, MD;  Location: [facility] ENDOSCOPY;  Service: Cardiovascular;  Laterality: N/A;   CARDIOVERSION N/A 07/24/2020    Procedure: CARDIOVERSION;  Surgeon: Elder Negus, MD;  Location: [facility] ENDOSCOPY;  Service: Cardiovascular;  Laterality: N/A;   TEE WITHOUT CARDIOVERSION N/A 07/17/2020    Procedure: TRANSESOPHAGEAL ECHOCARDIOGRAM (TEE);  Surgeon: Elder Negus, MD;  Location: [facility] ENDOSCOPY;  Service: Cardiovascular;  Laterality: N/A;   TONSILLECTOMY          ROS- all systems are reviewed and negatives except as per HPI above         Current Outpatient Medications  Medication Sig Dispense Refill   alendronate (FOSAMAX) 70 MG tablet Take 70 mg by mouth every Sunday.        CALCIUM CITRATE PO Take 2 tablets by mouth daily.        ELIQUIS 5 MG TABS tablet TAKE 1 TABLET(5 MG) BY MOUTH  TWICE DAILY 60 tablet 2   flecainide (TAMBOCOR) 50 MG tablet TAKE 1 TABLET(50 MG) BY MOUTH TWICE DAILY 180 tablet 3   ibuprofen (ADVIL) 200 MG tablet Take 400 mg by mouth every 6 (six) hours as needed for headache.       metoprolol succinate (TOPROL-XL) 25 MG 24 hr tablet TAKE 2 TABLETS(50 MG) BY MOUTH DAILY 90 tablet 1   Multiple Vitamin (MULTI-VITAMIN DAILY PO) Take 2 tablets by mouth daily.         No current facility-administered medications for this visit.      Physical Exam:  Vitals reviewed  GEN- The patient is well appearing, alert and oriented x 3 today.   Head- normocephalic, atraumatic Eyes-  Sclera clear, conjunctiva pink Ears- hearing intact Oropharynx- clear Lungs- Clear to ausculation bilaterally, normal work of breathing Heart- Regular rate and rhythm, no murmurs, rubs or gallops, PMI not laterally displaced GI- soft, NT, ND, + BS Extremities- no clubbing, cyanosis, or edema     Event monitor 7/22 is reviewed and reveals AF burden 13% with both af and afl observed.  Post termination pauses of up to 6 seconds are noted   Echo 07/30/20- EF 55%, mild MR, mild atrial enlargement   Assessment and Plan:   Paroxysmal atrial fibrillation/ atrial flutter The patient has symptomatic, recurrent atrial fibrillation and atrial flutter.  she has failed medical therapy with flecainide. Chads2vasc score is at least 3.  she is anticoagulated with eliquis .  Risk, benefits, and alternatives to EP study and radiofrequency ablation for afib were again discussed in detail today. These risks include but are not limited to stroke, bleeding, vascular damage, tamponade, perforation, damage to the esophagus, lungs, and other structures, pulmonary vein stenosis, worsening renal function, and death. The patient understands these risk and wishes to proceed.    Cardiac CT reviewed at length with the patient today.  she reports compliance with OAC without interruption.  Hillis Range MD, [facility]  [facility] 08/17/2021 12:25 PM

## 2021-08-17 NOTE — Anesthesia Postprocedure Evaluation (Signed)
Anesthesia Post Note  Patient: [name]  Procedure(s) Performed: ATRIAL FIBRILLATION ABLATION     Patient location during evaluation: PACU Anesthesia Type: General Level of consciousness: awake and alert Pain management: pain level controlled Vital Signs Assessment: post-procedure vital signs reviewed and stable Respiratory status: spontaneous breathing, nonlabored ventilation, respiratory function stable and patient connected to nasal cannula oxygen Cardiovascular status: blood pressure returned to baseline and stable Postop Assessment: no apparent nausea or vomiting Anesthetic complications: no   No notable events documented.  Last Vitals:  Vitals:   08/17/21 1233 08/17/21 1520  BP: 110/65 (!) 126/55  Pulse: (!) 50 68  Resp:  14  Temp:  36.8 C  SpO2: 100%     Last Pain:  Vitals:   08/17/21 1709  TempSrc:   PainSc: 1                  Shelton Silvas

## 2021-08-18 ENCOUNTER — Encounter (HOSPITAL_COMMUNITY): Payer: Self-pay | Admitting: Internal Medicine

## 2021-08-18 MED FILL — Heparin Sod (Porcine)-NaCl IV Soln 1000 Unit/500ML-0.9%: INTRAVENOUS | Qty: 500 | Status: AC

## 2021-09-14 ENCOUNTER — Encounter (HOSPITAL_COMMUNITY): Payer: Self-pay | Admitting: Nurse Practitioner

## 2021-09-14 ENCOUNTER — Ambulatory Visit (HOSPITAL_COMMUNITY)
Admission: RE | Admit: 2021-09-14 | Discharge: 2021-09-14 | Disposition: A | Discharge status: Home / Self Care | Payer: Medicare Other | Source: Ambulatory Visit | Attending: Nurse Practitioner | Admitting: Nurse Practitioner

## 2021-09-14 ENCOUNTER — Other Ambulatory Visit: Payer: Self-pay

## 2021-09-14 VITALS — BP 134/66 | HR 68 | Ht 69.0 in | Wt 164.4 lb

## 2021-09-14 DIAGNOSIS — I480 Paroxysmal atrial fibrillation: Secondary | ICD-10-CM | POA: Insufficient documentation

## 2021-09-14 DIAGNOSIS — D6869 Other thrombophilia: Secondary | ICD-10-CM | POA: Diagnosis not present

## 2021-09-14 DIAGNOSIS — Z79899 Other long term (current) drug therapy: Secondary | ICD-10-CM | POA: Diagnosis not present

## 2021-09-14 DIAGNOSIS — Z87891 Personal history of nicotine dependence: Secondary | ICD-10-CM | POA: Diagnosis not present

## 2021-09-14 DIAGNOSIS — Z7901 Long term (current) use of anticoagulants: Secondary | ICD-10-CM | POA: Insufficient documentation

## 2021-09-14 NOTE — Progress Notes (Signed)
Primary Care Physician: Georgann Housekeeper, MD Referring Physician: Dr. Kerney Elbe Lewing is a 70 y.o. female with a h/o afib that is one month s/p ablation. She had some afib in the first week but since then feels much better with no afib. She is back to her usual routine. No groin or swallowing issues.   Today, she denies symptoms of palpitations, chest pain, shortness of breath, orthopnea, PND, lower extremity edema, dizziness, presyncope, syncope, or neurologic sequela. The patient is tolerating medications without difficulties and is otherwise without complaint today.   Past Medical History:  Diagnosis Date   Paroxysmal atrial fibrillation (HCC)    Sinus pause    post termination pauses of up to 6 seconds are noted   Typical atrial flutter (HCC)    Past Surgical History:  Procedure Laterality Date   ATRIAL FIBRILLATION ABLATION N/A 08/17/2021   Procedure: ATRIAL FIBRILLATION ABLATION;  Surgeon: Hillis Range, MD;  [facility]: [facility] INVASIVE CV LAB;  Service: Cardiovascular;  Laterality: N/A;   CARDIOVERSION N/A 07/17/2020   Procedure: CARDIOVERSION;  Surgeon: Elder Negus, MD;  Location: [facility] ENDOSCOPY;  Service: Cardiovascular;  Laterality: N/A;   CARDIOVERSION N/A 07/24/2020   Procedure: CARDIOVERSION;  Surgeon: Elder Negus, MD;  Location: [facility]OS[facility];  Service: Cardiovascular;  Laterality: N/A;   TEE WITHOUT CARDIOVERSION N/A 07/17/2020   Procedure: TRANSESOPHAGEAL ECHOCARDIOGRAM (TEE);  Surgeon: Elder Negus, MD;  Location: [facility] ENDOSCOPY;  Service: Cardiovascular;  Laterality: N/A;   TONSILLECTOMY      Current Outpatient Medications  Medication Sig Dispense Refill   alendronate (FOSAMAX) 70 MG tablet Take 70 mg by mouth every Sunday.      CALCIUM CITRATE PO Take 2 tablets by mouth daily.      ELIQUIS 5 MG TABS tablet TAKE 1 TABLET(5 MG) BY MOUTH TWICE DAILY (Patient taking differently: Take 5 mg by mouth 2 (two) times daily.) 60 tablet 2    flecainide (TAMBOCOR) 100 MG tablet Take 1 tablet (100 mg total) by mouth 2 (two) times daily. 180 tablet 3   ibuprofen (ADVIL) 200 MG tablet Take 400 mg by mouth as needed for headache.     Multiple Vitamin (MULTI-VITAMIN DAILY PO) Take 2 tablets by mouth daily.      pantoprazole (PROTONIX) 40 MG tablet Take 1 tablet (40 mg total) by mouth daily. 45 tablet 0   No current facility-administered medications for this encounter.    No Known Allergies  Social History   Socioeconomic History   Marital status: Married    Spouse name: Not on file   Number of children: 3   Years of education: Not on file   Highest education level: Not on file  Occupational History   Not on file  Tobacco Use   Smoking status: Former    Packs/day: 0.25    Years: 3.00    Pack years: 0.75    Types: Cigarettes    Quit date: 62    Years since quitting: 48.8   Smokeless tobacco: Former  Building services engineer Use: Never used  Substance and Sexual Activity   Alcohol use: Yes    Comment: occasional beer   Drug use: Never   Sexual activity: Not on file  Other Topics Concern   Not on file  Social History Narrative   Lives in [place] with spouse (retired Optician, dispensing)   Social Determinants of Corporate investment banker Strain: Not on [employer] Insecurity: Not on file  Transportation Needs: Not on file  Physical Activity: Not on file  Stress: Not on file  Social Connections: Not on file  Intimate Partner Violence: Not on file    Family History  Problem Relation Age of Onset   Parkinson's disease Father     ROS- All systems are reviewed and negative except as per the HPI above  Physical Exam: Vitals:   09/14/21 1458  BP: 134/66  Pulse: 68  Weight: 74.6 kg  Height: 5\' 9"  (1.753 m)   Wt Readings from Last 3 Encounters:  09/14/21 74.6 kg  08/17/21 72.1 kg  07/12/21 74.8 kg    Labs: Lab Results  Component Value Date   NA 139 07/12/2021   K 5.1 07/12/2021   CL 100 07/12/2021   CO2  26 07/12/2021   GLUCOSE 95 07/12/2021   BUN 18 07/12/2021   CREATININE 1.42 (H) 07/12/2021   CALCIUM 9.9 07/12/2021   No results found for: INR No results found for: CHOL, HDL, LDLCALC, TRIG   GEN- The patient is well appearing, alert and oriented x 3 today.   Head- normocephalic, atraumatic Eyes-  Sclera clear, conjunctiva pink Ears- hearing intact Oropharynx- clear Neck- supple, no JVP Lymph- no cervical lymphadenopathy Lungs- Clear to ausculation bilaterally, normal work of breathing Heart- Regular rate and rhythm, no murmurs, rubs or gallops, PMI not laterally displaced GI- soft, NT, ND, + BS Extremities- no clubbing, cyanosis, or edema MS- no significant deformity or atrophy Skin- no rash or lesion Psych- euthymic mood, full affect Neuro- strength and sensation are intact  EKG-NSR at 68 bpm, pr int 180 ms, qrs int 90 ms,s qtc 442 ms     Assessment and Plan:  1. Paroxysmal afib  S/p ablation and is maintaining  SR She feels much improved  Continue flecainide 100 mg bid   2. CHA2DS2VASc  score of 3 Continue eliquis 5 mg bid   F/u with Dr. 09/11/2021 11/22/21  11/24/21. [facility] [address] [place], [place] [phone]

## 2021-09-28 ENCOUNTER — Other Ambulatory Visit: Payer: Self-pay | Admitting: Internal Medicine

## 2021-10-08 ENCOUNTER — Other Ambulatory Visit: Payer: Self-pay | Admitting: Cardiology

## 2021-11-08 ENCOUNTER — Other Ambulatory Visit: Payer: Self-pay | Admitting: Cardiology

## 2021-11-08 DIAGNOSIS — I483 Typical atrial flutter: Secondary | ICD-10-CM

## 2021-11-22 ENCOUNTER — Other Ambulatory Visit: Payer: Self-pay

## 2021-11-22 ENCOUNTER — Ambulatory Visit (INDEPENDENT_AMBULATORY_CARE_PROVIDER_SITE_OTHER): Payer: Medicare Other | Admitting: Internal Medicine

## 2021-11-22 ENCOUNTER — Encounter: Payer: Self-pay | Admitting: Internal Medicine

## 2021-11-22 VITALS — BP 106/64 | HR 64 | Ht 69.0 in | Wt 165.0 lb

## 2021-11-22 DIAGNOSIS — I480 Paroxysmal atrial fibrillation: Secondary | ICD-10-CM | POA: Diagnosis not present

## 2021-11-22 DIAGNOSIS — I483 Typical atrial flutter: Secondary | ICD-10-CM | POA: Diagnosis not present

## 2021-11-22 DIAGNOSIS — I455 Other specified heart block: Secondary | ICD-10-CM

## 2021-11-22 MED ORDER — FLECAINIDE ACETATE 50 MG PO TABS: 50.0000 mg | ORAL_TABLET | Freq: Two times a day (BID) | ORAL | 0 refills | Status: DC

## 2021-11-22 NOTE — Progress Notes (Signed)
° °  PCP: Georgann Housekeeper, MD Primary Cardiologist: Dr Maurilio Lovely is a 70 y.o. female who presents today for routine electrophysiology followup.  Since his recent afib ablation, the patient reports doing very well.  she denies procedure related complications and is pleased with the results of the procedure.  Today, she denies symptoms of palpitations, chest pain, shortness of breath,  lower extremity edema, dizziness, presyncope, or syncope.  The patient is otherwise without complaint today.   Past Medical History:  Diagnosis Date   Paroxysmal atrial fibrillation (HCC)    Sinus pause    post termination pauses of up to 6 seconds are noted   Typical atrial flutter (HCC)    Past Surgical History:  Procedure Laterality Date   ATRIAL FIBRILLATION ABLATION N/A 08/17/2021   Procedure: ATRIAL FIBRILLATION ABLATION;  Surgeon: Hillis Range, MD;  [facility]: [facility] INVASIVE CV LAB;  Service: Cardiovascular;  Laterality: N/A;   CARDIOVERSION N/A 07/17/2020   Procedure: CARDIOVERSION;  Surgeon: Elder Negus, MD;  Location: [facility] ENDOSCOPY;  Service: Cardiovascular;  Laterality: N/A;   CARDIOVERSION N/A 07/24/2020   Procedure: CARDIOVERSION;  Surgeon: Elder Negus, MD;  Location: [facility] ENDOSCOPY;  Service: Cardiovascular;  Laterality: N/A;   TEE WITHOUT CARDIOVERSION N/A 07/17/2020   Procedure: TRANSESOPHAGEAL ECHOCARDIOGRAM (TEE);  Surgeon: Elder Negus, MD;  Location: [facility] ENDOS[facility];  Service: Cardiovascular;  Laterality: N/A;   TONSILLECTOMY      ROS- all systems are personally reviewed and negatives except as per HPI above  Current Outpatient Medications  Medication Sig Dispense Refill   alendronate (FOSAMAX) 70 MG tablet Take 70 mg by mouth every Sunday.      CALCIUM CITRATE PO Take 2 tablets by mouth daily.      ELIQUIS 5 MG TABS tablet TAKE 1 TABLET(5 MG) BY MOUTH TWICE DAILY 60 tablet 2   flecainide (TAMBOCOR) 100 MG tablet Take 1 tablet (100 mg total) by mouth 2  (two) times daily. 180 tablet 3   ibuprofen (ADVIL) 200 MG tablet Take 400 mg by mouth as needed for headache.     metoprolol succinate (TOPROL-XL) 25 MG 24 hr tablet TAKE 2 TABLETS(50 MG) BY MOUTH DAILY 90 tablet 1   Multiple Vitamin (MULTI-VITAMIN DAILY PO) Take 2 tablets by mouth daily.      pantoprazole (PROTONIX) 40 MG tablet TAKE 1 TABLET(40 MG) BY MOUTH DAILY 90 tablet 3   No current facility-administered medications for this visit.    Physical Exam: Vitals:   11/22/21 1106  BP: 106/64  Pulse: 64  SpO2: 99%  Weight: 165 lb (74.8 kg)  Height: 5\' 9"  (1.753 m)    GEN- The patient is well appearing, alert and oriented x 3 today.   Head- normocephalic, atraumatic Eyes-  Sclera clear, conjunctiva pink Ears- hearing intact Oropharynx- clear Lungs-  normal work of breathing Heart- Regular rate and rhythm  GI- soft  Extremities- no clubbing, cyanosis, or edema  EKG tracing ordered today is personally reviewed and shows sinus  Assessment and Plan:  1. Paroxysmal atrial fibrillation/ atrial flutter Doing well s/p ablation chads2vasc score is 3.  She is on eliquis for stroke prevention stop flecainide after the holidays (She will reduce to 50mg  BID today and then stop in 2 weeks).  2. Post termination pauses Resolved post ablation   Return to AF clinic in 4 months  MD, [facility] 11/22/2021 11:09 AM

## 2021-11-22 NOTE — Patient Instructions (Addendum)
Medication Instructions:  Reduce Flecainide to 50 mg two times a day for 2 weeks then stop Your physician recommends that you continue on your current medications as directed. Please refer to the Current Medication list given to you today. *If you need a refill on your cardiac medications before your next appointment, please call your pharmacy*  Lab Work: None. If you have labs (blood work) drawn today and your tests are completely normal, you will receive your results only by: MyChart Message (if you have MyChart) OR A paper copy in the mail If you have any lab test that is abnormal or we need to change your treatment, we will call you to review the results.  Testing/Procedures: None.  Follow-Up: At [facility], you and your health needs are our priority.  As part of our continuing mission to provide you with exceptional heart care, we have created designated Provider Care Teams.  These Care Teams include your primary Cardiologist (physician) and Advanced Practice Providers (APPs -  Physician Assistants and Nurse Practitioners) who all work together to provide you with the care you need, when you need it.  Your physician wants you to follow-up in: 4 months in the Afib Clinic. They will contact you to schedule.   We recommend signing up for the patient portal called "MyChart".  Sign up information is provided on this After Visit Summary.  MyChart is used to connect with patients for Virtual Visits (Telemedicine).  Patients are able to view lab/test results, encounter notes, upcoming appointments, etc.  Non-urgent messages can be sent to your provider as well.   To learn more about what you can do with MyChart, go to [email or web address].    Any Other Special Instructions Will Be Listed Below (If Applicable).

## 2022-02-08 ENCOUNTER — Other Ambulatory Visit: Payer: Self-pay | Admitting: Cardiology

## 2022-02-08 DIAGNOSIS — I483 Typical atrial flutter: Secondary | ICD-10-CM

## 2022-03-25 ENCOUNTER — Telehealth: Payer: Self-pay | Admitting: Internal Medicine

## 2022-03-25 NOTE — Telephone Encounter (Signed)
Pt feels her afib burden has started to increase in frequency/duration over the last month or so. Wondering if she should think about restarting medications that were discontinued back in 2022. Pt due for appt in afib clinic -- offered appt to further discuss. Pt in agreement. ?

## 2022-03-25 NOTE — Telephone Encounter (Signed)
Pt states that she think she went into Afib last night. She states that she feels fine today but wanted  Dr. Johney Frame to be aware. Pt would like a callback. Please advise ?

## 2022-04-06 ENCOUNTER — Ambulatory Visit (HOSPITAL_COMMUNITY)
Admission: RE | Admit: 2022-04-06 | Discharge: 2022-04-06 | Disposition: A | Discharge status: Home / Self Care | Payer: Medicare Other | Source: Ambulatory Visit | Attending: Physician Assistant | Admitting: Physician Assistant

## 2022-04-06 ENCOUNTER — Encounter (HOSPITAL_COMMUNITY): Payer: Self-pay | Admitting: Physician Assistant

## 2022-04-06 VITALS — BP 118/10 | HR 135 | Ht 69.0 in | Wt 159.8 lb

## 2022-04-06 DIAGNOSIS — I480 Paroxysmal atrial fibrillation: Secondary | ICD-10-CM | POA: Insufficient documentation

## 2022-04-06 DIAGNOSIS — D6869 Other thrombophilia: Secondary | ICD-10-CM | POA: Diagnosis not present

## 2022-04-06 DIAGNOSIS — Z7901 Long term (current) use of anticoagulants: Secondary | ICD-10-CM | POA: Diagnosis not present

## 2022-04-06 DIAGNOSIS — I483 Typical atrial flutter: Secondary | ICD-10-CM | POA: Insufficient documentation

## 2022-04-06 DIAGNOSIS — Z79899 Other long term (current) drug therapy: Secondary | ICD-10-CM | POA: Insufficient documentation

## 2022-04-06 MED ORDER — METOPROLOL SUCCINATE ER 25 MG PO TB24: 12.5000 mg | ORAL_TABLET | Freq: Every day | ORAL | 1 refills | Status: DC

## 2022-04-06 MED ORDER — FLECAINIDE ACETATE 100 MG PO TABS: 100.0000 mg | ORAL_TABLET | Freq: Two times a day (BID) | ORAL | 3 refills | Status: DC

## 2022-04-06 NOTE — Patient Instructions (Signed)
Start Flecainide 100mg  twice a day ? ?Start Metoprolol 1/2 tablet once a day ?

## 2022-04-06 NOTE — Progress Notes (Signed)
? ?Primary Care Physician: Georgann Housekeeper, MD ?Referring Physician: Dr. Johney Frame  ? ? ?Annette Hampton is a 71 y.o. female with a h/o afib and atrial flutter s/p ablation. She presents for follow up in the [facility] Atrial Fibrillation Clinic. Patient reports that since stopping flecainide she has had increasing frequency of tachypalpitations. Episodes are now as frequent as once per week. She went into afib this morning. There are no specific triggers that she can identify. No bleeding issues on anticoagulation.  ? ?Today, she denies symptoms of chest pain, shortness of breath, orthopnea, PND, lower extremity edema, dizziness, presyncope, syncope, or neurologic sequela. The patient is tolerating medications without difficulties and is otherwise without complaint today.  ? ?Past Medical History:  ?Diagnosis Date  ? Paroxysmal atrial fibrillation (HCC)   ? Sinus pause   ? post termination pauses of up to 6 seconds are noted  ? Typical atrial flutter (HCC)   ? ?Past Surgical History:  ?Procedure Laterality Date  ? ATRIAL FIBRILLATION ABLATION N/A 08/17/2021  ? Procedure: ATRIAL FIBRILLATION ABLATION;  Surgeon: Hillis Range, MD;  [facility] CV LAB;  Service: Cardiovascular;  Laterality: N/A;  ? CARDIOVERSION N/A 07/17/2020  ? Procedure: CARDIOVERSION;  Surgeon: Elder Negus, MD;  Location: [facility] ENDOSCOPY;  Service: Cardiovascular;  Laterality: N/A;  ? CARDIOVERSION N/A 07/24/2020  ? Procedure: CARDIOVERSION;  Surgeon: Elder Negus, MD;  Location: [facility] ENDOSCOPY;  Service: Cardiovascular;  Laterality: N/A;  ? TEE WITHOUT CARDIOVERSION N/A 07/17/2020  ? Procedure: TRANSESOPHAGEAL ECHOCARDIOGRAM (TEE);  Surgeon: Elder Negus, MD;  Location[facility] ENDOSCOPY;  Service: Cardiovascular;  Laterality: N/A;  ? TONSILLECTOMY    ? ? ?Current Outpatient Medications  ?Medication Sig Dispense Refill  ? alendronate (FOSAMAX) 70 MG tablet Take 70 mg by mouth every Sunday.     ? CALCIUM CITRATE PO Take 2  tablets by mouth daily.     ? ELIQUIS 5 MG TABS tablet TAKE 1 TABLET(5 MG) BY MOUTH TWICE DAILY 60 tablet 2  ? flecainide (TAMBOCOR) 100 MG tablet Take 1 tablet (100 mg total) by mouth 2 (two) times daily. 60 tablet 3  ? ibuprofen (ADVIL) 200 MG tablet Take 400 mg by mouth as needed for headache.    ? Multiple Vitamin (MULTI-VITAMIN DAILY PO) Take 2 tablets by mouth daily.     ? pantoprazole (PROTONIX) 40 MG tablet TAKE 1 TABLET(40 MG) BY MOUTH DAILY 90 tablet 3  ? metoprolol succinate (TOPROL-XL) 25 MG 24 hr tablet Take 0.5 tablets (12.5 mg total) by mouth daily. 45 tablet 1  ? ?No current facility-administered medications for this encounter.  ? ? ?No Known Allergies ? ?Social History  ? ?Socioeconomic History  ? Marital status: Married  ?  Spouse name: Not on file  ? Number of children: 3  ? Years of education: Not on file  ? Highest education level: Not on file  ?Occupational History  ? Not on file  ?Tobacco Use  ? Smoking status: Former  ?  Packs/day: 0.25  ?  Years: 3.00  ?  Pack years: 0.75  ?  Types: Cigarettes  ?  Quit date: 67  ?  Years since quitting: 49.3  ? Smokeless tobacco: Former  ? Tobacco comments:  ?  Former smoker 04/06/22  ?Vaping Use  ? Vaping Use: Never used  ?Substance and Sexual Activity  ? Alcohol use: Yes  ?  Alcohol/week: 4.0 standard drinks  ?  Types: 4 Cans of beer per week  ?  Comment: 1 glass of wine once a week/1 light beer 4 times a week 04/06/22  ? Drug use: Never  ? Sexual activity: Not on file  ?Other Topics Concern  ? Not on file  ?Social History Narrative  ? Lives in [place] with spouse (retired Optician, dispensing)  ? ?Social Determinants of Health  ? ?Financial Resource Strain: Not on file  ?Food Insecurity: Not on file  ?Transportation Needs: Not on file  ?Physical Activity: Not on file  ?Stress: Not on file  ?Social Connections: Not on file  ?Intimate Partner Violence: Not on file  ? ? ?Family History  ?Problem Relation Age of Onset  ? Parkinson's disease Father   ? ? ?ROS- All  systems are reviewed and negative except as per the HPI above ? ?Physical Exam: ?Vitals:  ? 04/06/22 1107  ?BP: (!) 118/10  ?Pulse: (!) 135  ?Weight: 72.5 kg  ?Height: 5\' 9"  (1.753 m)  ? ? ?Wt Readings from Last 3 Encounters:  ?04/06/22 72.5 kg  ?11/22/21 74.8 kg  ?09/14/21 74.6 kg  ? ? ?Labs: ?Lab Results  ?Component Value Date  ? NA 139 07/12/2021  ? K 5.1 07/12/2021  ? CL 100 07/12/2021  ? CO2 26 07/12/2021  ? GLUCOSE 95 07/12/2021  ? BUN 18 07/12/2021  ? CREATININE 1.42 (H) 07/12/2021  ? CALCIUM 9.9 07/12/2021  ? ?No results found for: INR ?No results found for: CHOL, HDL, LDLCALC, TRIG ? ? ?GEN- The patient is a well appearing female, alert and oriented x 3 today.   ?HEENT-head normocephalic, atraumatic, sclera clear, conjunctiva pink, hearing intact, trachea midline. ?Lungs- Clear to ausculation bilaterally, normal work of breathing ?Heart- irregular rate and rhythm, no murmurs, rubs or gallops  ?GI- soft, NT, ND, + BS ?Extremities- no clubbing, cyanosis, or edema ?MS- no significant deformity or atrophy ?Skin- no rash or lesion ?Psych- euthymic mood, full affect ?Neuro- strength and sensation are intact ? ? ?EKG- ?Afib with RVR ?Vent. rate 135 BPM ?PR interval * ms ?QRS duration 80 ms ?QT/QTcB 270/405 ms ? ? ?Assessment and Plan:  ?1. Paroxysmal afib/typical atrial flutter ?S/p ablation 08/17/21 ?Patient having recurrent, rapid afib.  ?We discussed rhythm control options. Will restart flecainide 100 mg BID.  ?Restart Toprol 12.5 mg daily ?Continue Eliquis 5 mg BID ? ?2. CHA2DS2VASc  score of 3 ?Continue eliquis 5 mg bid  ? ? ?Follow up in the AF clinic early next week for ECG.  ? ? ?Ricky Luismiguel Lamere PA-C ?Afib Clinic ?[facility] ?[address] ?[place], [place] ?[phone]  ?

## 2022-04-12 ENCOUNTER — Encounter (HOSPITAL_COMMUNITY): Payer: Medicare Other | Admitting: Physician Assistant

## 2022-04-15 ENCOUNTER — Ambulatory Visit (HOSPITAL_COMMUNITY)
Admission: RE | Admit: 2022-04-15 | Discharge: 2022-04-15 | Disposition: A | Discharge status: Home / Self Care | Payer: Medicare Other | Source: Ambulatory Visit | Attending: Physician Assistant | Admitting: Physician Assistant

## 2022-04-15 DIAGNOSIS — I480 Paroxysmal atrial fibrillation: Secondary | ICD-10-CM | POA: Insufficient documentation

## 2022-04-15 DIAGNOSIS — D6869 Other thrombophilia: Secondary | ICD-10-CM

## 2022-04-15 NOTE — Progress Notes (Signed)
Patient returns for ECG after resuming flecainide. ECG shows conversion to SR HR 57, PR 194, QRS 98, QTc 432. Patient reports she has not had any further episodes of afib since resuming flecainide. Will f/u in AF clinic in 3 months.  ?

## 2022-05-11 ENCOUNTER — Other Ambulatory Visit: Payer: Self-pay | Admitting: Cardiology

## 2022-05-11 DIAGNOSIS — I483 Typical atrial flutter: Secondary | ICD-10-CM

## 2022-07-14 ENCOUNTER — Ambulatory Visit (HOSPITAL_COMMUNITY): Payer: Medicare Other | Admitting: Physician Assistant

## 2022-07-20 ENCOUNTER — Ambulatory Visit (HOSPITAL_COMMUNITY)
Admission: RE | Admit: 2022-07-20 | Discharge: 2022-07-20 | Disposition: A | Discharge status: Home / Self Care | Payer: Medicare Other | Source: Ambulatory Visit | Attending: Physician Assistant | Admitting: Physician Assistant

## 2022-07-20 ENCOUNTER — Encounter (HOSPITAL_COMMUNITY): Payer: Self-pay | Admitting: Physician Assistant

## 2022-07-20 VITALS — BP 104/60 | HR 59 | Ht 69.0 in | Wt 160.0 lb

## 2022-07-20 DIAGNOSIS — Z7901 Long term (current) use of anticoagulants: Secondary | ICD-10-CM | POA: Diagnosis not present

## 2022-07-20 DIAGNOSIS — Z79899 Other long term (current) drug therapy: Secondary | ICD-10-CM | POA: Diagnosis not present

## 2022-07-20 DIAGNOSIS — D6869 Other thrombophilia: Secondary | ICD-10-CM | POA: Diagnosis not present

## 2022-07-20 DIAGNOSIS — I4892 Unspecified atrial flutter: Secondary | ICD-10-CM | POA: Diagnosis present

## 2022-07-20 DIAGNOSIS — I480 Paroxysmal atrial fibrillation: Secondary | ICD-10-CM | POA: Diagnosis not present

## 2022-07-20 NOTE — Progress Notes (Signed)
Primary Care Physician: Georgann Housekeeper, MD Referring Physician: Dr. Kerney Elbe Bolio is a 71 y.o. female with a h/o afib and atrial flutter s/p ablation on 08/17/21. She presents for follow up in the [facility] Atrial Fibrillation Clinic. She had an increased burden of afib since stopping flecainide post ablation. Flecainide was restarted 04/06/22.  On follow up today, patient reports she has done very well since her last visit. She denies any tachypalpitations. No bleeding issues with anticoagulation.   Today, she denies symptoms of palpitations, chest pain, shortness of breath, orthopnea, PND, lower extremity edema, dizziness, presyncope, syncope, or neurologic sequela. The patient is tolerating medications without difficulties and is otherwise without complaint today.   Past Medical History:  Diagnosis Date   Paroxysmal atrial fibrillation (HCC)    Sinus pause    post termination pauses of up to 6 seconds are noted   Typical atrial flutter (HCC)    Past Surgical History:  Procedure Laterality Date   ATRIAL FIBRILLATION ABLATION N/A 08/17/2021   Procedure: ATRIAL FIBRILLATION ABLATION;  Surgeon: Hillis Range, MD;  [facility]: [facility] INVAS[facility] CV LAB;  Service: Cardiovascular;  Laterality: N/A;   CARDIOVERSION N/A 07/17/2020   Procedure: CARDIOVERSION;  Surgeon: Elder Negus, MD;  Location: [facility] ENDOSCOPY;  Service: Cardiovascular;  Laterality: N/A;   CARDIOVERSION N/A 07/24/2020   Procedure: CARDIOVERSION;  Surgeon: Elder Negus, MD;  Location: [facility] ENDOSCOPY;  Service: Cardiovascular;  Laterality: N/A;   TEE WITHOUT CARDIOVERSION N/A 07/17/2020   Procedure: TRANSESOPHAGEAL ECHOCARDIOGRAM (TEE);  Surgeon: Elder Negus, MD;  Location: [facility] ENDOSCOPY;  Service: Cardiovascular;  Laterality: N/A;   TONSILLECTOMY      Current Outpatient Medications  Medication Sig Dispense Refill   alendronate (FOSAMAX) 70 MG tablet Take 70 mg by mouth every Sunday.      CALCIUM  CITRATE PO Take 2 tablets by mouth daily.      ELIQUIS 5 MG TABS tablet TAKE 1 TABLET(5 MG) BY MOUTH TWICE DAILY 60 tablet 2   flecainide (TAMBOCOR) 100 MG tablet Take 1 tablet (100 mg total) by mouth 2 (two) times daily. 60 tablet 3   ibuprofen (ADVIL) 200 MG tablet Take 400 mg by mouth as needed for headache.     metoprolol succinate (TOPROL-XL) 25 MG 24 hr tablet Take 0.5 tablets (12.5 mg total) by mouth daily. 45 tablet 1   Multiple Vitamin (MULTI-VITAMIN DAILY PO) Take 2 tablets by mouth daily.      pantoprazole (PROTONIX) 40 MG tablet TAKE 1 TABLET(40 MG) BY MOUTH DAILY 90 tablet 3   No current facility-administered medications for this encounter.    No Known Allergies  Social History   Socioeconomic History   Marital status: Married    Spouse name: Not on file   Number of children: 3   Years of education: Not on file   Highest education level: Not on file  Occupational History   Not on file  Tobacco Use   Smoking status: Former    Packs/day: 0.25    Years: 3.00    Total pack years: 0.75    Types: Cigarettes    Quit date: 60    Years since quitting: 49.6   Smokeless tobacco: Former   Tobacco comments:    Former smoker 04/06/22  Vaping Use   Vaping Use: Never used  Substance and Sexual Activity   Alcohol use: Yes    Alcohol/week: 4.0 standard drinks of alcohol    Types: 4 Cans of beer  per week    Comment: 1 glass of wine once a week/1 light beer 4 times a week 04/06/22   Drug use: Never   Sexual activity: Not on file  Other Topics Concern   Not on file  Social History Narrative   Lives in [place] with spouse (retired Optician, dispensing)   Social Determinants of Corporate investment banker Strain: Not on Ship broker Insecurity: Not on file  Transportation Needs: Not on file  Physical Activity: Not on file  Stress: Not on file  Social Connections: Not on file  Intimate Partner Violence: Not on file    Family History  Problem Relation Age of Onset    Parkinson's disease Father     ROS- All systems are reviewed and negative except as per the HPI above  Physical Exam: Vitals:   07/20/22 1330  BP: 104/60  Pulse: (!) 59  Weight: 72.6 kg  Height: 5\' 9"  (1.753 m)     Wt Readings from Last 3 Encounters:  07/20/22 72.6 kg  04/06/22 72.5 kg  11/22/21 74.8 kg    Labs: Lab Results  Component Value Date   NA 139 07/12/2021   K 5.1 07/12/2021   CL 100 07/12/2021   CO2 26 07/12/2021   GLUCOSE 95 07/12/2021   BUN 18 07/12/2021   CREATININE 1.42 (H) 07/12/2021   CALCIUM 9.9 07/12/2021   No results found for: "INR" No results found for: "CHOL", "HDL", "LDLCALC", "TRIG"   GEN- The patient is a well appearing female, alert and oriented x 3 today.   HEENT-head normocephalic, atraumatic, sclera clear, conjunctiva pink, hearing intact, trachea midline. Lungs- Clear to ausculation bilaterally, normal work of breathing Heart- Regular rate and rhythm, no murmurs, rubs or gallops  GI- soft, NT, ND, + BS Extremities- no clubbing, cyanosis, or edema MS- no significant deformity or atrophy Skin- no rash or lesion Psych- euthymic mood, full affect Neuro- strength and sensation are intact   EKG- SB Vent. rate 59 BPM PR interval 188 ms QRS duration 96 ms QT/QTcB 430/425 ms   CHA2DS2-VASc Score = 3  The patient's score is based upon: CHF History: 0 HTN History: 0 Diabetes History: 0 Stroke History: 0 Vascular Disease History: 1 (aortic atherosclerosis) Age Score: 1 Gender Score: 1       ASSESSMENT AND PLAN: 1. Paroxysmal Atrial Fibrillation/atrial flutter The patient's CHA2DS2-VASc score is 3, indicating a 3.2% annual risk of stroke.   S/p ablation 08/17/21 Patient appears to be maintaining SR. Continue flecainide 100 mg BID Continue Toprol 12.5 mg daily Continue Eliquis 5 mg BID Apple Watch for home monitoring.   2. Secondary Hypercoagulable State (ICD10:  D68.69) The patient is at significant risk for  stroke/thromboembolism based upon her CHA2DS2-VASc Score of 3.  Continue Apixaban (Eliquis).    Follow up in the AF clinic in 6 months.    08/19/21 PA-C Afib Clinic [facility] [address] [place], [place] [phone]

## 2022-08-04 ENCOUNTER — Other Ambulatory Visit (HOSPITAL_COMMUNITY): Payer: Self-pay | Admitting: *Deleted

## 2022-08-04 MED ORDER — FLECAINIDE ACETATE 100 MG PO TABS: 100.0000 mg | ORAL_TABLET | Freq: Two times a day (BID) | ORAL | 6 refills | Status: DC

## 2022-08-08 ENCOUNTER — Other Ambulatory Visit: Payer: Self-pay | Admitting: Cardiology

## 2022-08-08 DIAGNOSIS — I483 Typical atrial flutter: Secondary | ICD-10-CM

## 2022-08-16 ENCOUNTER — Other Ambulatory Visit (HOSPITAL_COMMUNITY): Payer: Self-pay

## 2022-08-16 DIAGNOSIS — I483 Typical atrial flutter: Secondary | ICD-10-CM

## 2022-08-16 MED ORDER — APIXABAN 5 MG PO TABS: ORAL_TABLET | ORAL | 11 refills | Status: DC

## 2022-08-19 ENCOUNTER — Other Ambulatory Visit: Payer: Self-pay | Admitting: Internal Medicine

## 2022-08-19 DIAGNOSIS — M810 Age-related osteoporosis without current pathological fracture: Secondary | ICD-10-CM

## 2022-08-19 DIAGNOSIS — Z1231 Encounter for screening mammogram for malignant neoplasm of breast: Secondary | ICD-10-CM

## 2022-09-29 ENCOUNTER — Other Ambulatory Visit (HOSPITAL_COMMUNITY): Payer: Self-pay

## 2022-09-29 MED ORDER — METOPROLOL SUCCINATE ER 25 MG PO TB24: 12.5000 mg | ORAL_TABLET | Freq: Every day | ORAL | 1 refills | Status: DC

## 2022-12-17 IMAGING — CT CT HEART MORPH/PULM VEIN W/ CM & W/O CA SCORE
2 of 5 series · 9 of 20 positions shown, 11 images · non-contrast
Comparison: None.
COMPARISON: None.

Addendum:
EXAM:
OVER-READ INTERPRETATION  CT CHEST

The following report is an over-read performed by radiologist Dr.
Presido Case [[email or web address]] on 08/10/2021. This
over-read does not include interpretation of cardiac or coronary
anatomy or pathology. The coronary calcium score/coronary CTA
interpretation by the cardiologist is attached.
CLINICAL DATA: Atrial fibrillation scheduled for ablation.
Cardiac CTA
TECHNIQUE: A non-contrast, gated CT scan was obtained with axial slices of 3 mm
through the heart for calcium scoring. Calcium scoring was performed
using the Agatston method. A 120 kV retrospective, gated, contrast
cardiac scan was obtained. Gantry rotation speed was 250 msecs and
collimation was 0.6 mm. Nitroglycerin was not given. A delayed scan
was obtained to exclude left atrial appendage thrombus. The 3D
dataset was reconstructed in 5% intervals of the 0-95% of the R-R
cycle. Late systolic phases were analyzed on a dedicated workstation
using MPR, MIP, and VRT modes. The patient received 80 cc of
contrast.

[Series 6: best syst · axial · 0.38mm/px · z∈[-270,-160]mm · 7 of 368 slices shown, 9 images]
[im 46/368  vessel]
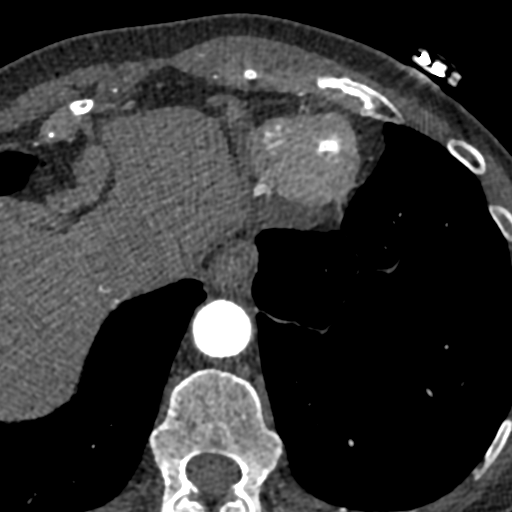
[im 46/368  lung]
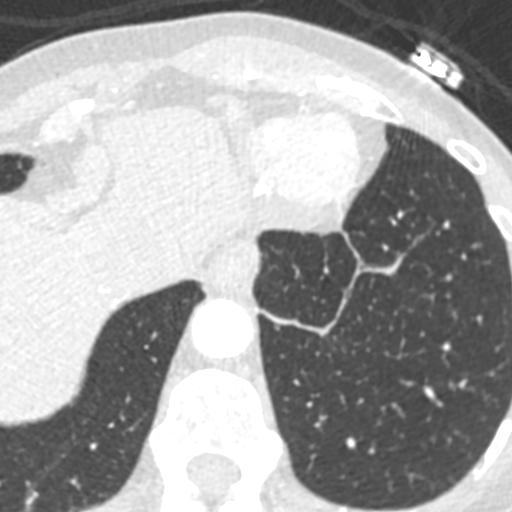
[im 92/368  vessel]
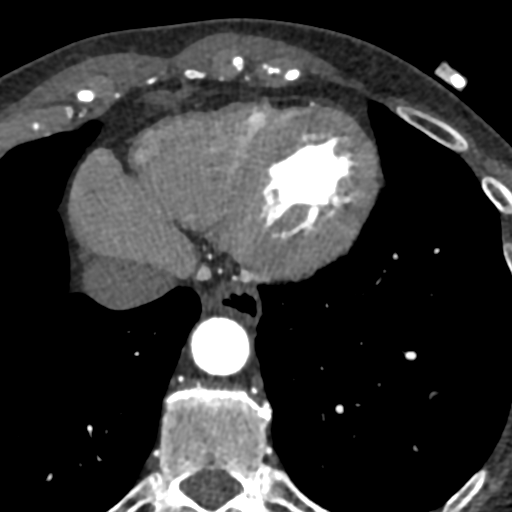
[im 138/368  vessel]
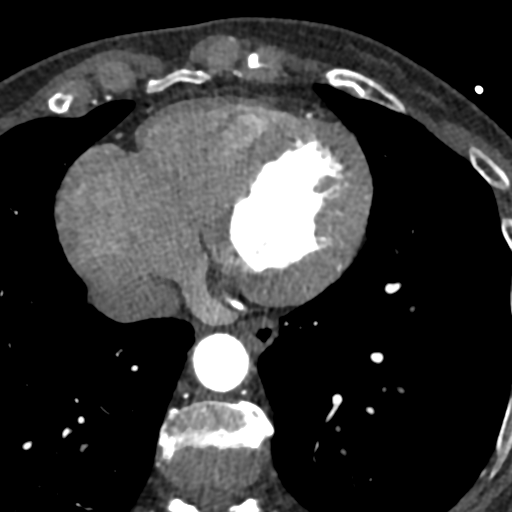
[im 184/368  vessel]
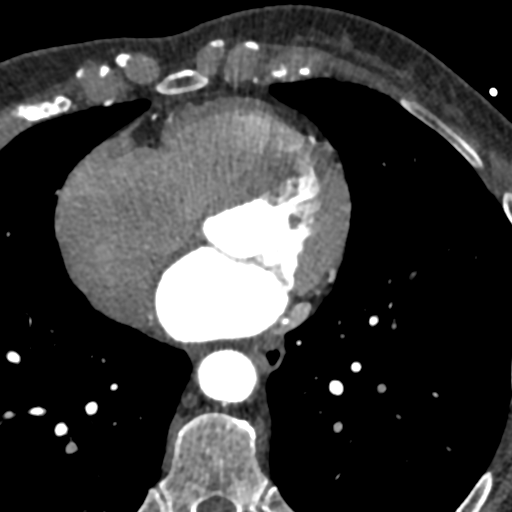
[im 230/368  vessel]
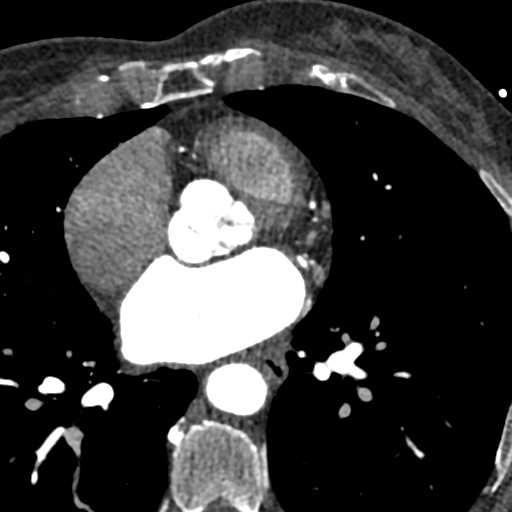
[im 230/368  lung]
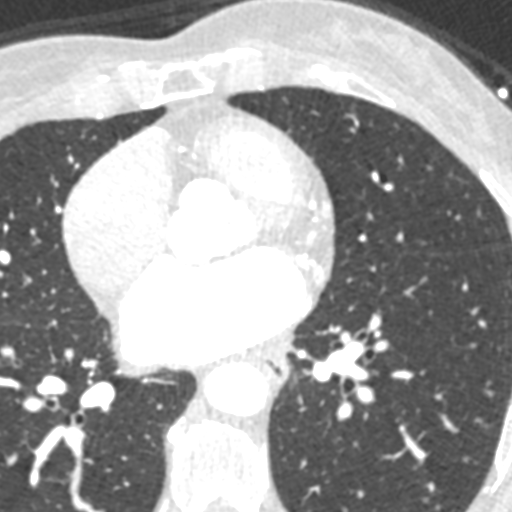
[im 276/368  vessel]
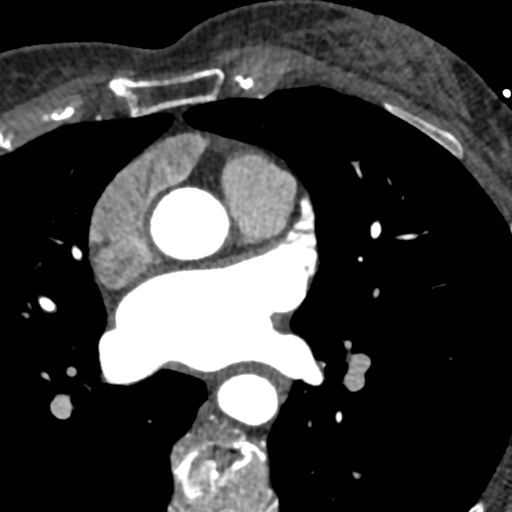
[im 322/368  vessel]
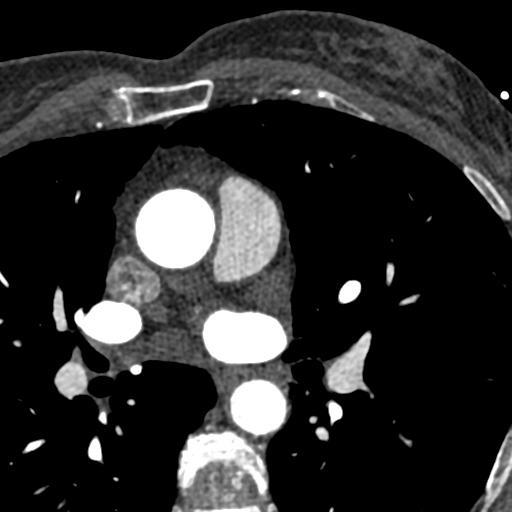

[Series 10: pv delay · axial · portal-venous · 0.38mm/px · z∈[-200,-171]mm · 2 of 176 slices shown]
[im 59/176  vessel]
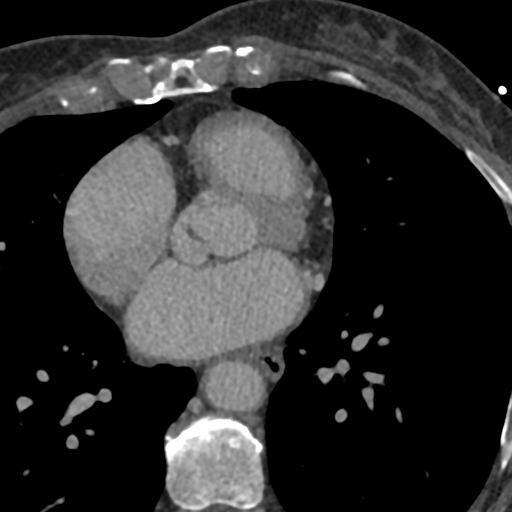
[im 117/176  vessel]
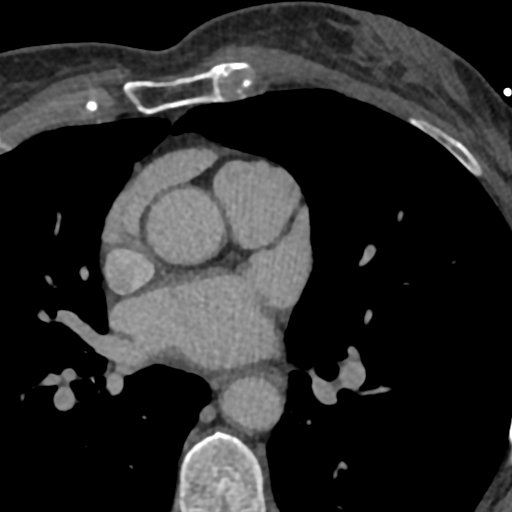

[9 of 20 positions shown; findings below may reference images not displayed]

FINDINGS: Atherosclerotic calcifications in the thoracic aorta. Within the
visualized portions of the thorax there are no suspicious appearing
pulmonary nodules or masses, there is no acute consolidative
airspace disease, no pleural effusions, no pneumothorax and no
lymphadenopathy. Visualized portions of the upper abdomen are
unremarkable. There are no aggressive appearing lytic or blastic
lesions noted in the visualized portions of the skeleton.
IMPRESSION: 1.  Aortic Atherosclerosis ([id number].Y).
FINDINGS: Image quality: Excellent

Noise artifact is: Limited

Pulmonary Veins: There is normal pulmonary vein drainage into the
left atrium (2 on the right and 2 on the left) with ostial
measurements as follows:

RUPV: Ostium 19.3 mm x 14.2 mm mm, area 2.08 cm2

RLPV: Ostium 26.0 mm x 23.3 mm mm, area 4.51 cm2

LUPV:  Ostium 23.4 mm x 16.4 mm mm, area 2.99 cm2

LLPV:  Ostium 20.2 mm x 11.3 mm  mm, area 1.64 cm2

Left Atrium: The left atrial size is normal. There is no PFO/ASD.
The left atrial appendage is windsock type and ostial size 29.4 mm x
18.2 mm mm. There is no thrombus in the left atrial appendage on
contrast or delayed imaging. The esophagus runs in the left atrial
left side and is in proximity to the left lower pulmonary vein
ostium.

Coronary Arteries: CAC score of 0, which is 0 percentile for age-,
race-, and sex-matched controls. Normal coronary origin. Left
dominance. The study was performed without use of NTG and is
insufficient for plaque evaluation.

Pericardium: Normal thickness with no significant effusion or
calcium present.

Pulmonary Artery: Normal caliber without proximal filling defect.

Cardiac valves: The aortic valve is trileaflet without significant
calcification. The mitral valve is normal structure without
significant calcification.

Aorta: Normal caliber with no significant disease.

Extra-cardiac findings: See attached radiology report for
non-cardiac structures.
IMPRESSION: 1. There is normal pulmonary vein drainage into the left atrium with
ostial measurements above.

2. There is no thrombus in the left atrial appendage.

3. The esophagus runs in the left atrial left side and is in
proximity to the left lower pulmonary vein ostium.

4. No PFO/ASD.

5. Normal coronary origin. Left dominance.

6. CAC score of 0 which is 0 percentile for age-, race-, and
sex-matched controls.

*** End of Addendum ***
EXAM:
OVER-READ INTERPRETATION  CT CHEST

The following report is an over-read performed by radiologist Dr.
Presido Case [[email or web address]] on 08/10/2021. This
over-read does not include interpretation of cardiac or coronary
anatomy or pathology. The coronary calcium score/coronary CTA
interpretation by the cardiologist is attached.
FINDINGS: Atherosclerotic calcifications in the thoracic aorta. Within the
visualized portions of the thorax there are no suspicious appearing
pulmonary nodules or masses, there is no acute consolidative
airspace disease, no pleural effusions, no pneumothorax and no
lymphadenopathy. Visualized portions of the upper abdomen are
unremarkable. There are no aggressive appearing lytic or blastic
lesions noted in the visualized portions of the skeleton.
IMPRESSION: 1.  Aortic Atherosclerosis ([id number].Y).

## 2023-01-12 ENCOUNTER — Encounter (HOSPITAL_COMMUNITY): Payer: Self-pay | Admitting: *Deleted

## 2023-01-19 ENCOUNTER — Ambulatory Visit (HOSPITAL_COMMUNITY)
Admission: RE | Admit: 2023-01-19 | Discharge: 2023-01-19 | Disposition: A | Discharge status: Home / Self Care | Payer: Medicare Other | Source: Ambulatory Visit | Attending: Physician Assistant | Admitting: Physician Assistant

## 2023-01-19 ENCOUNTER — Encounter (HOSPITAL_COMMUNITY): Payer: Self-pay | Admitting: Physician Assistant

## 2023-01-19 VITALS — BP 126/62 | HR 58 | Ht 69.0 in | Wt 160.4 lb

## 2023-01-19 DIAGNOSIS — I480 Paroxysmal atrial fibrillation: Secondary | ICD-10-CM

## 2023-01-19 DIAGNOSIS — Z79899 Other long term (current) drug therapy: Secondary | ICD-10-CM | POA: Insufficient documentation

## 2023-01-19 DIAGNOSIS — D6869 Other thrombophilia: Secondary | ICD-10-CM | POA: Insufficient documentation

## 2023-01-19 DIAGNOSIS — I4892 Unspecified atrial flutter: Secondary | ICD-10-CM | POA: Diagnosis not present

## 2023-01-19 DIAGNOSIS — Z7901 Long term (current) use of anticoagulants: Secondary | ICD-10-CM | POA: Diagnosis not present

## 2023-01-19 NOTE — Progress Notes (Signed)
Primary Care Physician: Wenda Low, MD Referring Physician: Dr. [name] is a 72 y.o. female with a h/o afib and atrial flutter s/p ablation on 08/17/21. She presents for follow up in the [facility]. She had an increased burden of afib since stopping flecainide post ablation. Flecainide was restarted 04/06/22.  On follow up today, patient reports that she has done well since her last visit with no interim episodes of afib. No bleeding issues on anticoagulation.   Today, she denies symptoms of palpitations, chest pain, shortness of breath, orthopnea, PND, lower extremity edema, dizziness, presyncope, syncope, or neurologic sequela. The patient is tolerating medications without difficulties and is otherwise without complaint today.   Past Medical History:  Diagnosis Date   Paroxysmal atrial fibrillation (HCC)    Sinus pause    post termination pauses of up to 6 seconds are noted   Typical atrial flutter ([place])    Past Surgical History:  Procedure Laterality Date   ATRIAL FIBRILLATION ABLATION N/A 08/17/2021   Procedure: ATRIAL FIBRILLATION ABLATION;  Surgeon: Thompson Grayer, MD;  Location: [facility];  Service: Cardiovascular;  Laterality: N/A;   CARDIOVERSION N/A 07/17/2020   Procedure: CARDIOVERSION;  Surgeon: Nigel Mormon, MD;  Location: [facility];  Service: Cardiovascular;  Laterality: N/A;   CARDIOVERSION N/A 07/24/2020   Procedure: CARDIOVERSION;  Surgeon: Nigel Mormon, MD;  Location: [facility];  Service: Cardiovascular;  Laterality: N/A;   TEE WITHOUT CARDIOVERSION N/A 07/17/2020   Procedure: TRANSESOPHAGEAL ECHOCARDIOGRAM (TEE);  Surgeon: Nigel Mormon, MD;  Location: [facility];  Service: Cardiovascular;  Laterality: N/A;   TONSILLECTOMY      Current Outpatient Medications  Medication Sig Dispense Refill   alendronate (FOSAMAX) 70 MG tablet Take 70 mg by mouth every Sunday.      apixaban  (ELIQUIS) 5 MG TABS tablet TAKE 1 TABLET(5 MG) BY MOUTH TWICE DAILY 60 tablet 11   CALCIUM CITRATE PO Take 2 tablets by mouth daily.      flecainide (TAMBOCOR) 100 MG tablet Take 1 tablet (100 mg total) by mouth 2 (two) times daily. 60 tablet 6   ibuprofen (ADVIL) 200 MG tablet Take 400 mg by mouth as needed for headache.     metoprolol succinate (TOPROL-XL) 25 MG 24 hr tablet Take 0.5 tablets (12.5 mg total) by mouth daily. 45 tablet 1   Multiple Vitamin (MULTI-VITAMIN DAILY PO) Take 2 tablets by mouth daily.      pantoprazole (PROTONIX) 40 MG tablet TAKE 1 TABLET(40 MG) BY MOUTH DAILY 90 tablet 3   No current facility-administered medications for this encounter.    No Known Allergies  Social History   Socioeconomic History   Marital status: Married    Spouse name: Not on file   Number of children: 3   Years of education: Not on file   Highest education level: Not on file  Occupational History   Not on file  Tobacco Use   Smoking status: Former    Packs/day: 0.25    Years: 3.00    Total pack years: 0.75    Types: Cigarettes    Quit date: 27    Years since quitting: 50.1   Smokeless tobacco: Former   Tobacco comments:    Former smoker 04/06/22  Vaping Use   Vaping Use: Never used  Substance and Sexual Activity   Alcohol use: Yes    Alcohol/week: 4.0 standard drinks of alcohol    Types: 4  Cans of beer per week    Comment: 1 glass of wine once a week/1 light beer 4 times a week 04/06/22   Drug use: Never   Sexual activity: Not on file  Other Topics Concern   Not on file  Social History Narrative   Lives in [place] with spouse (retired Company secretary)   Social Determinants of Radio broadcast assistant Strain: Not on Art therapist Insecurity: Not on file  Transportation Needs: Not on file  Physical Activity: Not on file  Stress: Not on file  Social Connections: Not on file  Intimate Partner Violence: Not on file    Family History  Problem Relation Age of Onset    Parkinson's disease Father     ROS- All systems are reviewed and negative except as per the HPI above  Physical Exam: Vitals:   01/19/23 1326  BP: 126/62  Pulse: (!) 58  Weight: 72.8 kg  Height: 5' 9"$  (1.753 m)      Wt Readings from Last 3 Encounters:  01/19/23 72.8 kg  07/20/22 72.6 kg  04/06/22 72.5 kg    Labs: Lab Results  Component Value Date   NA 139 07/12/2021   K 5.1 07/12/2021   CL 100 07/12/2021   CO2 26 07/12/2021   GLUCOSE 95 07/12/2021   BUN 18 07/12/2021   CREATININE 1.42 (H) 07/12/2021   CALCIUM 9.9 07/12/2021   No results found for: "INR" No results found for: "CHOL", "HDL", "[place]", "TRIG"   GEN- The patient is a well appearing female, alert and oriented x 3 today.   HEENT-head normocephalic, atraumatic, sclera clear, conjunctiva pink, hearing intact, trachea midline. Lungs- Clear to ausculation bilaterally, normal work of breathing Heart- Regular rate and rhythm, no murmurs, rubs or gallops  GI- soft, NT, ND, + BS Extremities- no clubbing, cyanosis, or edema MS- no significant deformity or atrophy Skin- no rash or lesion Psych- euthymic mood, full affect Neuro- strength and sensation are intact   EKG- SB Vent. rate 58 BPM PR interval 186 ms QRS duration 94 ms QT/QTcB 446/437 ms   CHA2DS2-VASc Score = 3  The patient's score is based upon: CHF History: 0 HTN History: 0 Diabetes History: 0 Stroke History: 0 Vascular Disease History: 1 (aortic atherosclerosis) Age Score: 1 Gender Score: 1       ASSESSMENT AND PLAN: 1. Paroxysmal Atrial Fibrillation/atrial flutter The patient's CHA2DS2-VASc score is 3, indicating a 3.2% annual risk of stroke.   S/p ablation 08/17/21 Patient appears to be maintaining SR.  Continue flecainide 100 mg BID Continue Toprol 12.5 mg daily Continue Eliquis 5 mg BID Apple Watch for home monitoring.   2. Secondary Hypercoagulable State (ICD10:  D68.69) The patient is at significant risk for  stroke/thromboembolism based upon her CHA2DS2-VASc Score of 3.  Continue Apixaban (Eliquis).    Follow up with EP to establish care in 6 months. AF clinic in one year.    [facility] [address] [place],  [address] [phone]

## 2023-01-26 ENCOUNTER — Ambulatory Visit
Admission: RE | Admit: 2023-01-26 | Discharge: 2023-01-26 | Disposition: A | Discharge status: Home / Self Care | Payer: Medicare Other | Source: Ambulatory Visit | Attending: Internal Medicine | Admitting: Internal Medicine

## 2023-01-26 DIAGNOSIS — M810 Age-related osteoporosis without current pathological fracture: Secondary | ICD-10-CM

## 2023-01-26 DIAGNOSIS — Z1231 Encounter for screening mammogram for malignant neoplasm of breast: Secondary | ICD-10-CM

## 2023-02-21 ENCOUNTER — Telehealth: Payer: Self-pay | Admitting: *Deleted

## 2023-02-21 NOTE — Telephone Encounter (Signed)
   Pre-operative Risk Assessment    Patient Name: Annette Hampton  DOB: August 20, 1951 MRN: [id number]      Request for Surgical Clearance    Procedure:   COLONOSCOPY ; SCREENING FOR COLON CANCER  Date of Surgery:  Clearance 04/12/23                                 Surgeon:  NOT LISTED Surgeon's Group or Practice Name:  [facility] Phone number:  [phone] Fax number:  [phone]   Type of Clearance Requested:   - Medical  - Pharmacy:  Hold Apixaban (Eliquis) x 2 DAYS PRIOR   Type of Anesthesia:   PROPOFOL   Additional requests/questions:    Jiles Prows   02/21/2023, 1:18 PM

## 2023-02-22 NOTE — Telephone Encounter (Signed)
Patient scheduled to see Oda Kilts PA- C on 3/22 for preop clearance

## 2023-02-22 NOTE — Telephone Encounter (Signed)
   Name: Annette Hampton  DOB: November 06, 1951  MRN: [id number]  Primary Cardiologist: None  Chart reviewed as part of pre-operative protocol coverage. Because of Tae Pitcairn Politano's past medical history and time since last visit, she will require a follow-up in-office visit in order to better assess preoperative cardiovascular risk. Recent visits have been with afib clinic only.   Pre-op covering staff: - Please schedule appointment and call patient to inform them. If patient already had an upcoming appointment within acceptable timeframe, please add "pre-op clearance" to the appointment notes so provider is aware. - Please contact requesting surgeon's office via preferred method (i.e, phone, fax) to inform them of need for appointment prior to surgery.  This message will also be routed to pharmacy pool for input on holding Eliquis as requested below so that this information is available to the clearing provider at time of patient's appointment.   Mayra Reel, NP  02/22/2023, 10:50 AM

## 2023-02-22 NOTE — Telephone Encounter (Signed)
Patient with diagnosis of afib on Eliquis for anticoagulation.    Procedure: colonoscopy Date of procedure: 04/12/23  CHA2DS2-VASc Score = 3  This indicates a 3.2% annual risk of stroke. The patient's score is based upon: CHF History: 0 HTN History: 0 Diabetes History: 0 Stroke History: 0 Vascular Disease History: 1 (aortic atherosclerosis) Age Score: 1 Gender Score: 1   CrCl 70mL/min Platelet count 202K1.04  Per office protocol, patient can hold Eliquis for 2 days prior to procedure as requested.    **This guidance is not considered finalized until pre-operative APP has relayed final recommendations.**

## 2023-02-23 NOTE — Progress Notes (Signed)
  Electrophysiology Office Note:   Date:  02/24/2023  ID:  NIVAYA Hampton, DOB [birth year], MRN [id number]  Primary Cardiologist: None Electrophysiologist: Dr. Rayann Heman -> Will Meredith Leeds, MD   History of Present Illness:   Annette Hampton is a 72 y.o. female seen today for cardiac clearance for colonoscopy   Since last being seen in our clinic the patient reports doing very well. Has not felt breakthrough AF.  she denies chest pain, palpitations, dyspnea, PND, orthopnea, nausea, vomiting, dizziness, syncope, edema, weight gain, or early satiety.  Review of systems complete and found to be negative unless listed in HPI.   AF history S/p fib/flutter ablation 08/2021 Flecainide stopped after ablation -> restarted 04/2022  Studies Reviewed:    EKG is not ordered today. EKG from 01/19/2023 reviewed which showed sinus bradycardia at 58 bpm with stable intervals compared to prior (PR 186, QRS 94 ms)  Risk Assessment/Calculations:    CHA2DS2-VASc Score = 3           Physical Exam:   VS:  BP 112/70   Pulse (!) 55   Ht 5\' 9"  (1.753 m)   Wt 156 lb (70.8 kg)   SpO2 99%   BMI 23.04 kg/m    Wt Readings from Last 3 Encounters:  02/24/23 156 lb (70.8 kg)  01/19/23 160 lb 6.4 oz (72.8 kg)  07/20/22 160 lb (72.6 kg)     GEN: Well nourished, well developed in no acute distress NECK: No JVD; No carotid bruits CARDIAC: Regular rate and rhythm, no murmurs, rubs, gallops RESPIRATORY:  Clear to auscultation without rales, wheezing or rhonchi  ABDOMEN: Soft, non-tender, non-distended EXTREMITIES:  No edema; No deformity   ASSESSMENT AND PLAN:    Paroxysmal atrial fib Typical atrial flutter S/p ablation 2022 Stable interval by EKG 01/19/23  Continue flecainide 100 mg BID Continue toprol 12.5 mg daily Continue eliquis for CHA2DS2VASc  of at least 3. OK to hold for colonoscopy.   Cardiac clearance for colonoscopy The patient is cleared for surgery from a cardiac perspective with an  estimated Low Risk of perioperative cardiac complications by the Revised Cardiac Risk Index [place] Criteria). The patient may proceed without further cardiac work up.  OK to hold eliquis x 2 days, resume as soon as safe to do so afterwards.   The patient does not have an implanted cardiac device.    Follow up with Dr. Curt Bears in August to establish from Dr. Rayann Heman for flecainide f/u.  Signed, Shirley Friar, PA-C

## 2023-02-24 ENCOUNTER — Encounter: Payer: Self-pay | Admitting: Student

## 2023-02-24 ENCOUNTER — Ambulatory Visit: Payer: Medicare Other | Attending: Student | Admitting: Student

## 2023-02-24 VITALS — BP 112/70 | HR 55 | Ht 69.0 in | Wt 156.0 lb

## 2023-02-24 DIAGNOSIS — I483 Typical atrial flutter: Secondary | ICD-10-CM

## 2023-02-24 DIAGNOSIS — I480 Paroxysmal atrial fibrillation: Secondary | ICD-10-CM | POA: Insufficient documentation

## 2023-02-24 NOTE — Patient Instructions (Signed)
Medication Instructions:  Your physician recommends that you continue on your current medications as directed. Please refer to the Current Medication list given to you today.  *If you need a refill on your cardiac medications before your next appointment, please call your pharmacy*  Lab Work: None If you have labs (blood work) drawn today and your tests are completely normal, you will receive your results only by: [place] (if you have MyChart) OR A paper copy in the mail If you have any lab test that is abnormal or we need to change your treatment, we will call you to review the results.  Follow-Up: At [facility], you and your health needs are our priority.  As part of our continuing mission to provide you with exceptional heart care, we have created designated Provider Care Teams.  These Care Teams include your primary Cardiologist (physician) and Advanced Practice Providers (APPs -  Physician Assistants and Nurse Practitioners) who all work together to provide you with the care you need, when you need it.   Your next appointment:   5 month(s)  Provider:   Allegra Lai, MD

## 2023-03-02 ENCOUNTER — Other Ambulatory Visit (HOSPITAL_COMMUNITY): Payer: Self-pay | Admitting: Physician Assistant

## 2023-04-02 ENCOUNTER — Other Ambulatory Visit (HOSPITAL_COMMUNITY): Payer: Self-pay | Admitting: Physician Assistant

## 2023-08-16 ENCOUNTER — Other Ambulatory Visit (HOSPITAL_COMMUNITY): Payer: Self-pay | Admitting: Physician Assistant

## 2023-08-16 DIAGNOSIS — I483 Typical atrial flutter: Secondary | ICD-10-CM

## 2023-08-30 ENCOUNTER — Encounter: Payer: Self-pay | Admitting: Cardiology

## 2023-08-30 ENCOUNTER — Ambulatory Visit: Payer: Medicare Other | Attending: Cardiology | Admitting: Cardiology

## 2023-08-30 VITALS — BP 110/68 | HR 49 | Ht 69.0 in | Wt 159.0 lb

## 2023-08-30 DIAGNOSIS — I483 Typical atrial flutter: Secondary | ICD-10-CM | POA: Insufficient documentation

## 2023-08-30 DIAGNOSIS — I455 Other specified heart block: Secondary | ICD-10-CM | POA: Diagnosis present

## 2023-08-30 DIAGNOSIS — D6869 Other thrombophilia: Secondary | ICD-10-CM | POA: Diagnosis not present

## 2023-08-30 DIAGNOSIS — I480 Paroxysmal atrial fibrillation: Secondary | ICD-10-CM | POA: Insufficient documentation

## 2023-08-30 NOTE — Progress Notes (Signed)
Electrophysiology Office Note:   Date:  08/30/2023  ID:  Annette Hampton, DOB Nov 12, 1951, MRN [id number]  Primary Cardiologist: None Electrophysiologist: Kenlea Woodell Jorja Loa, MD      History of Present Illness:   Annette Hampton is a 72 y.o. female with h/o atrial fibrillation/flutter post ablation seen today for routine electrophysiology followup.   Since last being seen in our clinic the patient reports doing overall well.  Since her ablation, she has had no acute symptoms.  She has been able to do all of her daily activities without restriction.  She has been happy with her control.  She has had no issues with her flecainide.  She remains quite active.  she denies chest pain, palpitations, dyspnea, PND, orthopnea, nausea, vomiting, dizziness, syncope, edema, weight gain, or early satiety.   Review of systems complete and found to be negative unless listed in HPI.   EP Information / Studies Reviewed:    EKG is ordered today. Personal review as below.  EKG Interpretation Date/Time:  Wednesday August 30 2023 11:38:51 EDT Ventricular Rate:  51 PR Interval:  200 QRS Duration:  102 QT Interval:  462 QTC Calculation: 425 R Axis:   84  Text Interpretation: Sinus bradycardia Benign early repolarization When compared with ECG of 30-Aug-2023 11:37, No significant change was found Confirmed by Marcelo Ickes ([id number]) on 08/30/2023 11:53:16 AM     Risk Assessment/Calculations:    CHA2DS2-VASc Score = 2   This indicates a 2.2% annual risk of stroke. The patient's score is based upon: CHF History: 0 HTN History: 0 Diabetes History: 0 Stroke History: 0 Vascular Disease History: 0 Age Score: 1 Gender Score: 1              Physical Exam:   VS:  BP 110/68 (BP Location: Left Arm, Patient Position: Sitting, Cuff Size: Normal)   Pulse (!) 49   Ht 5\' 9"  (1.753 m)   Wt 159 lb (72.1 kg)   SpO2 98%   BMI 23.48 kg/m    Wt Readings from Last 3 Encounters:  08/30/23 159 lb (72.1  kg)  02/24/23 156 lb (70.8 kg)  01/19/23 160 lb 6.4 oz (72.8 kg)     GEN: Well nourished, well developed in no acute distress NECK: No JVD; No carotid bruits CARDIAC: Regular rate and rhythm, no murmurs, rubs, gallops RESPIRATORY:  Clear to auscultation without rales, wheezing or rhonchi  ABDOMEN: Soft, non-tender, non-distended EXTREMITIES:  No edema; No deformity   ASSESSMENT AND PLAN:    1.  Paroxysmal atrial fibrillation/typical atrial flutter: Post ablation in 2022.  Currently on flecainide and metoprolol.  Had minimal episodes of atrial fibrillation since her ablation.  Happy with her control.  No changes.  2.  Secondary hypercoagulable state: Currently on Eliquis for atrial fibrillation  Follow up with Afib Clinic in 6 months  Signed, Tyla Burgner Jorja Loa, MD

## 2023-08-30 NOTE — Patient Instructions (Signed)
Medication Instructions:  Your physician recommends that you continue on your current medications as directed. Please refer to the Current Medication list given to you today.  *If you need a refill on your cardiac medications before your next appointment, please call your pharmacy*   Lab Work: None ordered   Testing/Procedures: None ordered   Follow-Up: At [facility], you and your health needs are our priority.  As part of our continuing mission to provide you with exceptional heart care, we have created designated Provider Care Teams.  These Care Teams include your primary Cardiologist (physician) and Advanced Practice Providers (APPs -  Physician Assistants and Nurse Practitioners) who all work together to provide you with the care you need, when you need it.  We recommend signing up for the patient portal called "MyChart".  Sign up information is provided on this After Visit Summary.  MyChart is used to connect with patients for Virtual Visits (Telemedicine).  Patients are able to view lab/test results, encounter notes, upcoming appointments, etc.  Non-urgent messages can be sent to your provider as well.   To learn more about what you can do with MyChart, go to [email or web address].    Your next appointment:   6 month(s)  The format for your next appointment:   In Person  Provider:   You will follow up in the Atrial Fibrillation Clinic located at [facility]. Your provider will be: Clint R. Fenton, PA-C  Or Landry Mellow, PA-C  Thank you for choosing [facility]!!   Dory Horn, RN [phone]

## 2023-10-01 ENCOUNTER — Other Ambulatory Visit (HOSPITAL_COMMUNITY): Payer: Self-pay | Admitting: Physician Assistant

## 2023-12-15 ENCOUNTER — Ambulatory Visit (INDEPENDENT_AMBULATORY_CARE_PROVIDER_SITE_OTHER): Payer: Medicare Other | Admitting: Podiatry

## 2023-12-15 ENCOUNTER — Other Ambulatory Visit: Payer: Self-pay

## 2023-12-15 ENCOUNTER — Ambulatory Visit (INDEPENDENT_AMBULATORY_CARE_PROVIDER_SITE_OTHER): Payer: Medicare Other

## 2023-12-15 DIAGNOSIS — M7671 Peroneal tendinitis, right leg: Secondary | ICD-10-CM

## 2023-12-15 DIAGNOSIS — M79671 Pain in right foot: Secondary | ICD-10-CM

## 2023-12-15 MED ORDER — TRIAMCINOLONE ACETONIDE 10 MG/ML IJ SUSP
2.5000 mg | Freq: Once | INTRAMUSCULAR | Status: AC
Start: 2023-12-15 — End: ?

## 2023-12-15 MED ORDER — DEXAMETHASONE SODIUM PHOSPHATE 120 MG/30ML IJ SOLN: 4.0000 mg | Freq: Once | INTRAMUSCULAR | Status: AC

## 2023-12-15 MED ORDER — MELOXICAM 15 MG PO TABS: 15.0000 mg | ORAL_TABLET | Freq: Every day | ORAL | 0 refills | Status: DC

## 2023-12-15 NOTE — Patient Instructions (Signed)
 Peroneal Tendinopathy Rehab Ask your health care provider which exercises are safe for you. Do exercises exactly as told by your health care provider and adjust them as directed. It is normal to feel mild stretching, pulling, tightness, or discomfort as you do these exercises. Stop right away if you feel sudden pain or your pain gets worse. Do not begin these exercises until told by your health care provider. Stretching and range-of-motion exercises These exercises warm up your muscles and joints. They can help improve the movement and flexibility of your ankle. They may also help to relieve pain and stiffness. Gastrocnemius and soleus stretch, standing This is an exercise in which you stand on a step and use your body weight to stretch your calf muscles. To do this exercise: Stand on the edge of a step on the ball of your left / right foot. The ball of your foot is on the walking surface, right under your toes. Keep your other foot firmly on the same step. Hold on to the wall, a railing, or a chair for balance. Slowly lift your other foot, allowing your body weight to press your left / right heel down over the edge of the step. You should feel a stretch in your left / right calf (gastrocnemius and soleus). Hold this position for __________ seconds. Return both feet to the step. Repeat this exercise with a slight bend in your left / right knee. Repeat __________ times with your left / right knee straight and __________ times with your left / right knee bent. Complete this exercise __________ times a day. Strengthening exercises These exercises build strength and endurance in your foot and ankle. Endurance is the ability to use your muscles for a long time, even after they get tired. Ankle dorsiflexion with band  Secure a rubber exercise band or tube to an object, such as a table leg, that will not move when the band is pulled. Secure the other end of the band around your left / right foot. Sit on  the floor. Face the object with your left / right leg extended. The band or tube should be slightly tense when your foot is relaxed. Slowly flex your left / right ankle and toes to bring your foot toward you (dorsiflexion). Hold this position for __________ seconds. Let the band or tube slowly pull your foot back to the starting position. Repeat __________ times. Complete this exercise __________ times a day. Ankle eversion  Sit on the floor with your legs straight out in front of you. Loop a rubber exercise band or tube around the ball of your left / right foot. The ball of your foot is on the walking surface, right under your toes. Hold the ends of the band in your hands. You can also secure the band to a stable object. The band or tube should be slightly tense when your foot is relaxed. Slowly push your foot outward, away from your other leg (eversion). Hold this position for __________ seconds. Slowly return your foot to the starting position. Repeat __________ times. Complete this exercise __________ times a day. Plantar flexion, standing This exercise is sometimes called a standing heel raise. Stand with your feet shoulder-width apart. Place your hands on a wall or table to steady yourself as needed. Try not to use it for support. Keep your weight spread evenly over the width of your feet while you slowly rise up on your toes (plantar flexion). If told by your health care provider: Shift your weight  toward your left / right leg until you feel challenged. Stand on your left / right leg only. Hold this position for __________ seconds. Repeat __________ times. Complete this exercise __________ times a day. Single leg stand  Without shoes, stand near a railing or in a doorway. You may hold on to the railing or doorframe as needed. Stand on your left / right foot. Keep your big toe down on the floor and try to keep your arch lifted. Do not roll to the outside of your foot. If this  exercise is too easy, you can try it with your eyes closed or while standing on a pillow. Hold this position for __________ seconds. Repeat __________ times. Complete this exercise __________ times a day. This information is not intended to replace advice given to you by your health care provider. Make sure you discuss any questions you have with your health care provider. Document Revised: 03/17/2022 Document Reviewed: 03/17/2022 Elsevier Patient Education  2024 [employer].

## 2023-12-15 NOTE — Progress Notes (Signed)
  Subjective:  Patient ID: Annette Hampton, female    DOB: 08/20/1951,   MRN: [id number]  Chief Complaint  Patient presents with   Foot Pain         73 y.o. female presents for concern of right foot pain that has been ongoing for about a week. Relates she does a lot of walking and relates pain on the side of her ankle and foot that has been limiting her. She has been resting since it started but denies any other treatments. 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   . Denies any other pedal complaints. Denies n/v/f/c.   Past Medical History:  Diagnosis Date   Paroxysmal atrial fibrillation (HCC)    Sinus pause    post termination pauses of up to 6 seconds are noted   Typical atrial flutter (HCC)     Objective:  Physical Exam: Vascular: DP/PT pulses 2/4 bilateral. CFT <3 seconds. Normal hair growth on digits. No edema.  Skin. No lacerations or abrasions bilateral feet.  Musculoskeletal: MMT 5/5 bilateral lower extremities in DF, PF, Inversion and Eversion. Deceased ROM in DF of ankle joint. Tender along peroneal tendon mostly posterior to the lateral malleolus. No pain around insertion. Pain with eversion and inversion of the foot noted.  Neurological: Sensation intact to light touch.   Assessment:   1. Peroneal tendonitis, right      Plan:  Patient was evaluated and treated and all questions answered. X-rays reviewed and discussed with patient. No acute fractures or dislocations. Some spurring noted to the inferior calcaneus and degnerative changes noted at the first MPJ.  Discussed peroneal tendinitis and treatment options at length with patient Discussed stretching exercises and provided handout. Prescription for meloxicam  provided. Will just plan for one month as had increased in Cr and GFR in the past.  Dispensed Tri-Lock ankle brace. Patient requesting injection today. Procedure below.  Discussed that if the symptoms do not improve can consider  PT/MRI. Patient to return in 6 to 8 weeks or sooner if symptoms fail to improve or worsen.   Procedure: Injection Tendon/Ligament Discussed alternatives, risks, complications and verbal consent was obtained.  Location: Right peroneal tendon . Skin Prep: Alcohol. Injectate: 1cc 0.5% marcaine plain, 1 cc dexamethasone  0.5 cc kenalog   Disposition: Patient tolerated procedure well. Injection site dressed with a band-aid.  Post-injection care was discussed and return precautions discussed.     Asberry Failing, DPM

## 2024-01-10 ENCOUNTER — Other Ambulatory Visit: Payer: Self-pay | Admitting: Internal Medicine

## 2024-01-10 DIAGNOSIS — Z1231 Encounter for screening mammogram for malignant neoplasm of breast: Secondary | ICD-10-CM

## 2024-01-13 ENCOUNTER — Other Ambulatory Visit: Payer: Self-pay | Admitting: Podiatry

## 2024-01-17 ENCOUNTER — Other Ambulatory Visit (HOSPITAL_COMMUNITY): Payer: Self-pay | Admitting: Physician Assistant

## 2024-01-17 DIAGNOSIS — I483 Typical atrial flutter: Secondary | ICD-10-CM

## 2024-01-26 ENCOUNTER — Ambulatory Visit: Payer: Medicare Other | Admitting: Podiatry

## 2024-01-30 ENCOUNTER — Ambulatory Visit
Admission: RE | Admit: 2024-01-30 | Discharge: 2024-01-30 | Disposition: A | Discharge status: Home / Self Care | Payer: Medicare Other | Source: Ambulatory Visit | Attending: Internal Medicine | Admitting: Internal Medicine

## 2024-01-30 DIAGNOSIS — Z1231 Encounter for screening mammogram for malignant neoplasm of breast: Secondary | ICD-10-CM

## 2024-02-14 ENCOUNTER — Other Ambulatory Visit: Payer: Self-pay

## 2024-02-14 MED ORDER — MELOXICAM 15 MG PO TABS: 15.0000 mg | ORAL_TABLET | Freq: Every day | ORAL | 0 refills | Status: DC

## 2024-02-28 ENCOUNTER — Ambulatory Visit (HOSPITAL_COMMUNITY)
Admission: RE | Admit: 2024-02-28 | Discharge: 2024-02-28 | Disposition: A | Discharge status: Home / Self Care | Payer: Medicare Other | Source: Ambulatory Visit | Attending: Physician Assistant | Admitting: Physician Assistant

## 2024-02-28 ENCOUNTER — Encounter (HOSPITAL_COMMUNITY): Payer: Self-pay | Admitting: Physician Assistant

## 2024-02-28 VITALS — BP 124/74 | HR 55 | Ht 69.0 in | Wt 159.8 lb

## 2024-02-28 DIAGNOSIS — Z7901 Long term (current) use of anticoagulants: Secondary | ICD-10-CM | POA: Insufficient documentation

## 2024-02-28 DIAGNOSIS — D6869 Other thrombophilia: Secondary | ICD-10-CM | POA: Diagnosis not present

## 2024-02-28 DIAGNOSIS — Z5181 Encounter for therapeutic drug level monitoring: Secondary | ICD-10-CM | POA: Diagnosis not present

## 2024-02-28 DIAGNOSIS — I480 Paroxysmal atrial fibrillation: Secondary | ICD-10-CM | POA: Diagnosis not present

## 2024-02-28 DIAGNOSIS — Z79899 Other long term (current) drug therapy: Secondary | ICD-10-CM | POA: Diagnosis not present

## 2024-02-28 DIAGNOSIS — I4891 Unspecified atrial fibrillation: Secondary | ICD-10-CM | POA: Diagnosis present

## 2024-02-28 DIAGNOSIS — I4892 Unspecified atrial flutter: Secondary | ICD-10-CM | POA: Diagnosis present

## 2024-02-28 LAB — BASIC METABOLIC PANEL
Anion gap: 8 (ref 5–15)
BUN: 17 mg/dL (ref 8–23)
CO2: 25 mmol/L (ref 22–32)
Calcium: 9.2 mg/dL (ref 8.9–10.3)
Chloride: 106 mmol/L (ref 98–111)
Creatinine, Ser: 1.17 mg/dL — ABNORMAL HIGH (ref 0.44–1.00)
GFR, Estimated: 49 mL/min — ABNORMAL LOW (ref >60)
Glucose, Bld: 69 mg/dL — ABNORMAL LOW (ref 70–99)
Potassium: 3.9 mmol/L (ref 3.5–5.1)
Sodium: 139 mmol/L (ref 135–145)

## 2024-02-28 LAB — CBC
HCT: 41.2 % (ref 36.0–46.0)
Hemoglobin: 13.5 g/dL (ref 12.0–15.0)
MCH: 33.7 pg (ref 26.0–34.0)
MCHC: 32.8 g/dL (ref 30.0–36.0)
MCV: 102.7 fL — ABNORMAL HIGH (ref 80.0–100.0)
Platelets: 172 10*3/uL (ref 150–400)
RBC: 4.01 MIL/uL (ref 3.87–5.11)
RDW: 12.4 % (ref 11.5–15.5)
WBC: 5.1 10*3/uL (ref 4.0–10.5)
nRBC: 0 % (ref 0.0–0.2)

## 2024-02-28 NOTE — Progress Notes (Addendum)
 Primary Care Physician: Georgann Housekeeper, MD Primary Cardiologist: None Electrophysiologist: Will Jorja Loa, MD  Referring Physician: Dr Johney Frame   Annette Hampton is a 73 y.o. female with a history of atrial flutter and atrial fibrillation who presents for follow up in the [facility].  The patient is s/p afib and flutter ablation with Dr Johney Frame on 08/17/21. She has been maintained on flecainide. Patient is on Eliquis for stroke prevention.   Patient presents today for follow up for atrial fibrillation and flecainide monitoring. She reports that she has done well with no interim symptoms of afib. No bleeding issues on anticoagulation.   Today, she denies symptoms of palpitations, chest pain, shortness of breath, orthopnea, PND, lower extremity edema, dizziness, presyncope, syncope, snoring, daytime somnolence, bleeding, or neurologic sequela. The patient is tolerating medications without difficulties and is otherwise without complaint today.    Atrial Fibrillation Risk Factors:  she does not have symptoms or diagnosis of sleep apnea. she does not have a history of rheumatic fever.   Atrial Fibrillation Management history:  Previous antiarrhythmic drugs: flecainide Previous cardioversions: 07/2020 x 2 Previous ablations: 08/17/21 Anticoagulation history: Eliquis  ROS- All systems are reviewed and negative except as per the HPI above.  Past Medical History:  Diagnosis Date   Paroxysmal atrial fibrillation (HCC)    Sinus pause    post termination pauses of up to 6 seconds are noted   Typical atrial flutter (HCC)     Current Outpatient Medications  Medication Sig Dispense Refill   CALCIUM CITRATE PO Take 2 tablets by mouth daily.      ELIQUIS 5 MG TABS tablet TAKE 1 TABLET(5 MG) BY MOUTH TWICE DAILY 60 tablet 11   flecainide (TAMBOCOR) 100 MG tablet TAKE 1 TABLET(100 MG) BY MOUTH TWICE DAILY 180 tablet 3   ibuprofen (ADVIL) 200 MG tablet Take  400 mg by mouth as needed for headache.     meloxicam (MOBIC) 15 MG tablet Take 1 tablet (15 mg total) by mouth daily. 30 tablet 0   metoprolol succinate (TOPROL-XL) 25 MG 24 hr tablet TAKE 1/2 TABLET(12.5 MG) BY MOUTH DAILY 45 tablet 1   Multiple Vitamin (MULTI-VITAMIN DAILY PO) Take 2 tablets by mouth daily.      pantoprazole (PROTONIX) 40 MG tablet TAKE 1 TABLET(40 MG) BY MOUTH DAILY 90 tablet 3   rosuvastatin (CRESTOR) 5 MG tablet Take 5 mg by mouth daily.     Current Facility-Administered Medications  Medication Dose Route Frequency Provider Last Rate Last Admin   dexamethasone (DECADRON) injection 4 mg  4 mg Intra-articular Once        triamcinolone acetonide (KENALOG) 10 MG/ML injection 2.5 mg  2.5 mg Intra-articular Once         Physical Exam: BP 124/74   Pulse (!) 55   Ht 5\' 9"  (1.753 m)   Wt 72.5 kg   BMI 23.60 kg/m   GEN: Well nourished, well developed in no acute distress CARDIAC: Regular rate and rhythm, no murmurs, rubs, gallops RESPIRATORY:  Clear to auscultation without rales, wheezing or rhonchi  ABDOMEN: Soft, non-tender, non-distended EXTREMITIES:  No edema; No deformity   Wt Readings from Last 3 Encounters:  02/28/24 72.5 kg  08/30/23 72.1 kg  02/24/23 70.8 kg     EKG today demonstrates  SB Vent. rate 55 BPM PR interval 200 ms QRS duration 88 ms QT/QTcB 430/411 ms   Echo 07/30/20 demonstrated  Left ventricle cavity is normal in size and  wall thickness. Normal global  wall motion. Normal LV systolic function with EF 55%. Normal diastolic  filling pattern.  Mild biatrial dilatation.  Mild (Grade I) mitral regurgitation.  Mild tricuspid regurgitation. Estimated pulmonary artery systolic pressure  32 mmHg.    CHA2DS2-VASc Score = 3  The patient's score is based upon: CHF History: 0 HTN History: 0 Diabetes History: 0 Stroke History: 0 Vascular Disease History: 1 (aortic atherosclerosis) Age Score: 1 Gender Score: 1       ASSESSMENT AND  PLAN: Paroxysmal Atrial Fibrillation/atrial flutter The patient's CHA2DS2-VASc score is 3, indicating a 3.2% annual risk of stroke.   S/p afib and flutter ablation 08/17/21 Patient appears to be maintaining SR Continue flecainide 100 mg BID Continue Toprol 12.5 mg daily Continue Eliquis 5 mg BID. Check bmet/cbc today. Apple Watch for home monitoring.   Secondary Hypercoagulable State (ICD10:  D68.69) The patient is at significant risk for stroke/thromboembolism based upon her CHA2DS2-VASc Score of 3.  Continue Apixaban (Eliquis). No bleeding issues.   High Risk Medication Monitoring (ICD 10: Z79.899) Intervals on ECG acceptable for flecainide monitoring.        Follow up in the AF clinic in 6 months.        Jorja Loa PA-C Afib Clinic [facility] [address] [place], [place] [address] [phone]

## 2024-02-28 NOTE — Addendum Note (Signed)
 Encounter addended by: Danice Goltz, PA on: 02/28/2024 11:31 AM  Actions taken: Clinical Note Signed

## 2024-03-06 ENCOUNTER — Other Ambulatory Visit (HOSPITAL_COMMUNITY): Payer: Self-pay | Admitting: Student

## 2024-03-06 NOTE — Telephone Encounter (Signed)
This is a A-Fib clinic pt 

## 2024-03-27 ENCOUNTER — Other Ambulatory Visit (HOSPITAL_COMMUNITY): Payer: Self-pay | Admitting: Physician Assistant

## 2024-06-27 ENCOUNTER — Other Ambulatory Visit (HOSPITAL_COMMUNITY): Payer: Self-pay

## 2024-06-27 ENCOUNTER — Telehealth: Payer: Self-pay

## 2024-06-27 ENCOUNTER — Other Ambulatory Visit (HOSPITAL_COMMUNITY): Payer: Self-pay | Admitting: Internal Medicine

## 2024-06-27 ENCOUNTER — Telehealth (HOSPITAL_COMMUNITY): Payer: Self-pay | Admitting: Pharmacy Technician

## 2024-06-27 DIAGNOSIS — M810 Age-related osteoporosis without current pathological fracture: Secondary | ICD-10-CM | POA: Insufficient documentation

## 2024-06-27 NOTE — Telephone Encounter (Signed)
 Auth Submission: NO AUTH NEEDED Site of care: Site of care: [facility] Payer: Medicare A/B with BCBS supplement Medication & CPT/J Code(s) submitted: Prolia (Denosumab) [id number] Diagnosis Code:  Route of submission (phone, fax, portal):  Phone # Fax # Auth type: Buy/Bill PB Units/visits requested: 60mg  x 2 doses Reference number:  Approval from: 06/27/24 to 01/04/25

## 2024-06-27 NOTE — Telephone Encounter (Signed)
 Auth Submission: NO AUTH NEEDED Site of care: [facility] Payer: Medicare A/B, BCBS Supp Medication & CPT/J Code(s) submitted: Prolia (Denosumab) [id number] Diagnosis Code: M81.0 Route of submission (phone, fax, portal):  Phone # Fax # Auth type: Buy/Bill HB Units/visits requested: 60mg  x 2 doses, q 6 months Reference number:  Approval from: 06/27/24 to 01/04/25  Medicare A/B will cover 80%, BCBS Supp will cover remaining 20%. Med will be covered at 100%.     Virgen Belland, CPhT [facility] Phone: [phone] 06/27/2024

## 2024-08-09 ENCOUNTER — Ambulatory Visit (HOSPITAL_COMMUNITY)
Admission: RE | Admit: 2024-08-09 | Discharge: 2024-08-09 | Disposition: A | Discharge status: Home / Self Care | Source: Ambulatory Visit | Attending: Internal Medicine | Admitting: Internal Medicine

## 2024-08-09 DIAGNOSIS — M810 Age-related osteoporosis without current pathological fracture: Secondary | ICD-10-CM | POA: Diagnosis present

## 2024-08-09 MED ORDER — DENOSUMAB 60 MG/ML ~~LOC~~ SOSY
PREFILLED_SYRINGE | SUBCUTANEOUS | Status: AC
  Filled 2024-08-09: qty 1

## 2024-08-09 MED ORDER — DENOSUMAB 60 MG/ML ~~LOC~~ SOSY
60.0000 mg | PREFILLED_SYRINGE | Freq: Once | SUBCUTANEOUS | Status: AC
  Administered 2024-08-09: 60 mg via SUBCUTANEOUS

## 2024-08-22 ENCOUNTER — Other Ambulatory Visit (HOSPITAL_COMMUNITY): Payer: Self-pay | Admitting: *Deleted

## 2024-08-22 DIAGNOSIS — I483 Typical atrial flutter: Secondary | ICD-10-CM

## 2024-08-22 MED ORDER — APIXABAN 5 MG PO TABS
5.0000 mg | ORAL_TABLET | Freq: Two times a day (BID) | ORAL | 6 refills | Status: DC
Start: 2024-08-22 — End: 2024-08-29

## 2024-08-28 ENCOUNTER — Ambulatory Visit (HOSPITAL_COMMUNITY)
Admission: RE | Admit: 2024-08-28 | Discharge: 2024-08-28 | Disposition: A | Discharge status: Home / Self Care | Source: Ambulatory Visit | Attending: Physician Assistant | Admitting: Physician Assistant

## 2024-08-28 VITALS — BP 106/62 | HR 53 | Ht 69.0 in | Wt 157.6 lb

## 2024-08-28 DIAGNOSIS — Z5181 Encounter for therapeutic drug level monitoring: Secondary | ICD-10-CM | POA: Insufficient documentation

## 2024-08-28 DIAGNOSIS — I480 Paroxysmal atrial fibrillation: Secondary | ICD-10-CM | POA: Insufficient documentation

## 2024-08-28 DIAGNOSIS — D6869 Other thrombophilia: Secondary | ICD-10-CM | POA: Diagnosis present

## 2024-08-28 DIAGNOSIS — Z79899 Other long term (current) drug therapy: Secondary | ICD-10-CM | POA: Diagnosis present

## 2024-08-28 DIAGNOSIS — I4891 Unspecified atrial fibrillation: Secondary | ICD-10-CM | POA: Insufficient documentation

## 2024-08-28 DIAGNOSIS — I483 Typical atrial flutter: Secondary | ICD-10-CM | POA: Diagnosis present

## 2024-08-28 NOTE — Progress Notes (Signed)
 Primary Care Physician: Ransom Other, MD Primary Cardiologist: None Electrophysiologist: Will Gladis Norton, MD  Referring Physician: Dr Kelsie   Annette Hampton is a 73 y.o. female with a history of atrial flutter and atrial fibrillation who presents for follow up in the [facility].  The patient is s/p afib and flutter ablation with Dr Kelsie on 08/17/21. She has been maintained on flecainide . Patient is on Eliquis  for stroke prevention.   Patient returns for follow up for atrial fibrillation and flecainide  monitoring. She reports that she has done well since her last visit. She had two brief episodes of tachypalpitations. One episode she believes was brought on by a missed dose of flecainide . No bleeding issues on anticoagulation.   Today, she  denies symptoms of chest pain, shortness of breath, orthopnea, PND, lower extremity edema, dizziness, presyncope, syncope, snoring, daytime somnolence, bleeding, or neurologic sequela. The patient is tolerating medications without difficulties and is otherwise without complaint today.    Atrial Fibrillation Risk Factors:  she does not have symptoms or diagnosis of sleep apnea. she does not have a history of rheumatic fever.   Atrial Fibrillation Management history:  Previous antiarrhythmic drugs: flecainide  Previous cardioversions: 07/2020 x 2 Previous ablations: 08/17/21 Anticoagulation history: Eliquis   ROS- All systems are reviewed and negative except as per the HPI above.  Past Medical History:  Diagnosis Date   Paroxysmal atrial fibrillation (HCC)    Sinus pause    post termination pauses of up to 6 seconds are noted   Typical atrial flutter (HCC)     Current Outpatient Medications  Medication Sig Dispense Refill   apixaban  (ELIQUIS ) 5 MG TABS tablet Take 1 tablet (5 mg total) by mouth 2 (two) times daily. 60 tablet 6   CALCIUM CITRATE PO Take 2 tablets by mouth daily.  (Patient taking  differently: Taking 2 gummie's daily)     denosumab  (PROLIA ) 60 MG/ML SOSY injection Inject 60 mg into the skin every 6 (six) months.     flecainide  (TAMBOCOR ) 100 MG tablet TAKE 1 TABLET(100 MG) BY MOUTH TWICE DAILY 180 tablet 1   ibuprofen (ADVIL) 200 MG tablet Take 400 mg by mouth as needed for headache.     metoprolol  succinate (TOPROL -XL) 25 MG 24 hr tablet TAKE 1/2 TABLET(12.5 MG) BY MOUTH DAILY 45 tablet 1   Multiple Vitamin (MULTI-VITAMIN DAILY PO) Take 2 tablets by mouth daily.      rosuvastatin (CRESTOR) 5 MG tablet Take 5 mg by mouth daily.     Current Facility-Administered Medications  Medication Dose Route Frequency Provider Last Rate Last Admin   dexamethasone  (DECADRON ) injection 4 mg  4 mg Intra-articular Once        triamcinolone  acetonide (KENALOG ) 10 MG/ML injection 2.5 mg  2.5 mg Intra-articular Once         Physical Exam: BP 106/62   Pulse (!) 53   Ht 5' 9 (1.753 m)   Wt 71.5 kg   BMI 23.27 kg/m   GEN: Well nourished, well developed in no acute distress CARDIAC: Regular rate and rhythm, no murmurs, rubs, gallops RESPIRATORY:  Clear to auscultation without rales, wheezing or rhonchi  ABDOMEN: Soft, non-tender, non-distended EXTREMITIES:  No edema; No deformity    Wt Readings from Last 3 Encounters:  08/28/24 71.5 kg  02/28/24 72.5 kg  08/30/23 72.1 kg     EKG today demonstrates  SB Vent. rate 53 BPM PR interval 200 ms QRS duration 92 ms QT/QTcB 452/424 ms  Echo 07/30/20 demonstrated  Left ventricle cavity is normal in size and wall thickness. Normal global  wall motion. Normal LV systolic function with EF 55%. Normal diastolic  filling pattern.  Mild biatrial dilatation.  Mild (Grade I) mitral regurgitation.  Mild tricuspid regurgitation. Estimated pulmonary artery systolic pressure  32 mmHg.    CHA2DS2-VASc Score = 3  The patient's score is based upon: CHF History: 0 HTN History: 0 Diabetes History: 0 Stroke History: 0 Vascular Disease  History: 1 (aortic atherosclerosis) Age Score: 1 Gender Score: 1       ASSESSMENT AND PLAN: Paroxysmal Atrial Fibrillation (ICD10:  I48.0) The patient's CHA2DS2-VASc score is 3, indicating a 3.2% annual risk of stroke.   S/p afib and flutter ablation 08/17/21 Patient appears to be maintaining SR with only two brief episodes. Patient would be open to repeat ablation if her afib burden increases.  Continue flecainide  100 mg BID Continue Toprol  12.5 mg daily Continue Eliquis  5 mg BID Apple Watch for home monitoring.   Secondary Hypercoagulable State (ICD10:  D68.69) The patient is at significant risk for stroke/thromboembolism based upon her CHA2DS2-VASc Score of 3.  Continue Apixaban  (Eliquis ). No bleeding issues.   High Risk Medication Monitoring (ICD 10: J342684) Patient requires ongoing monitoring for anti-arrhythmic medication which has the potential to cause life threatening arrhythmias. Intervals on ECG acceptable for flecainide  monitoring.      Follow up in the AF clinic in 6 months.    Daril Kicks PA-C Afib Clinic [facility] [address] [place], [place] [address] [phone]

## 2024-08-29 ENCOUNTER — Other Ambulatory Visit (HOSPITAL_COMMUNITY): Payer: Self-pay | Admitting: *Deleted

## 2024-08-29 DIAGNOSIS — I483 Typical atrial flutter: Secondary | ICD-10-CM

## 2024-08-29 MED ORDER — APIXABAN 5 MG PO TABS: 5.0000 mg | ORAL_TABLET | Freq: Two times a day (BID) | ORAL | 6 refills | Status: AC

## 2024-09-09 ENCOUNTER — Other Ambulatory Visit (HOSPITAL_COMMUNITY): Payer: Self-pay | Admitting: Physician Assistant

## 2024-09-25 ENCOUNTER — Other Ambulatory Visit (HOSPITAL_COMMUNITY): Payer: Self-pay | Admitting: Physician Assistant

## 2025-02-19 ENCOUNTER — Ambulatory Visit (HOSPITAL_COMMUNITY): Admitting: Physician Assistant
